# Patient Record
Sex: Female | Born: 1955
Health system: Southern US, Community
[De-identification: ages and names within clinical notes are randomized; demographics above are authoritative.]

## PROBLEM LIST (undated history)

## (undated) DIAGNOSIS — F329 Major depressive disorder, single episode, unspecified: Secondary | ICD-10-CM

## (undated) DIAGNOSIS — N200 Calculus of kidney: Secondary | ICD-10-CM

## (undated) DIAGNOSIS — F32A Depression, unspecified: Secondary | ICD-10-CM

## (undated) DIAGNOSIS — E7800 Pure hypercholesterolemia, unspecified: Secondary | ICD-10-CM

---

## 1999-05-20 ENCOUNTER — Other Ambulatory Visit: Admission: RE | Admit: 1999-05-20 | Discharge: 1999-05-20 | Payer: Self-pay | Admitting: *Deleted

## 2000-06-01 ENCOUNTER — Other Ambulatory Visit: Admission: RE | Admit: 2000-06-01 | Discharge: 2000-06-01 | Payer: Self-pay | Admitting: *Deleted

## 2000-06-13 ENCOUNTER — Encounter: Admission: RE | Admit: 2000-06-13 | Discharge: 2000-06-13 | Payer: Self-pay | Admitting: Family Medicine

## 2000-06-13 ENCOUNTER — Encounter: Payer: Self-pay | Admitting: Family Medicine

## 2001-07-16 ENCOUNTER — Other Ambulatory Visit: Admission: RE | Admit: 2001-07-16 | Discharge: 2001-07-16 | Payer: Self-pay | Admitting: *Deleted

## 2002-07-23 ENCOUNTER — Other Ambulatory Visit: Admission: RE | Admit: 2002-07-23 | Discharge: 2002-07-23 | Payer: Self-pay | Admitting: *Deleted

## 2002-07-30 ENCOUNTER — Encounter: Payer: Self-pay | Admitting: Family Medicine

## 2002-07-30 ENCOUNTER — Encounter: Admission: RE | Admit: 2002-07-30 | Discharge: 2002-07-30 | Payer: Self-pay | Admitting: Family Medicine

## 2003-12-02 ENCOUNTER — Other Ambulatory Visit: Admission: RE | Admit: 2003-12-02 | Discharge: 2003-12-02 | Payer: Self-pay | Admitting: *Deleted

## 2003-12-03 ENCOUNTER — Encounter: Admission: RE | Admit: 2003-12-03 | Discharge: 2003-12-03 | Payer: Self-pay | Admitting: Family Medicine

## 2005-03-18 ENCOUNTER — Other Ambulatory Visit: Admission: RE | Admit: 2005-03-18 | Discharge: 2005-03-18 | Payer: Self-pay | Admitting: *Deleted

## 2005-03-29 ENCOUNTER — Encounter: Admission: RE | Admit: 2005-03-29 | Discharge: 2005-03-29 | Payer: Self-pay | Admitting: Obstetrics and Gynecology

## 2007-05-15 ENCOUNTER — Other Ambulatory Visit: Admission: RE | Admit: 2007-05-15 | Discharge: 2007-05-15 | Payer: Self-pay | Admitting: Family Medicine

## 2008-05-22 ENCOUNTER — Other Ambulatory Visit: Admission: RE | Admit: 2008-05-22 | Discharge: 2008-05-22 | Payer: Self-pay | Admitting: Family Medicine

## 2008-06-19 ENCOUNTER — Encounter: Admission: RE | Admit: 2008-06-19 | Discharge: 2008-06-19 | Payer: Self-pay | Admitting: Family Medicine

## 2009-12-29 ENCOUNTER — Encounter: Admission: RE | Admit: 2009-12-29 | Discharge: 2009-12-29 | Payer: Self-pay | Admitting: Family Medicine

## 2010-01-06 ENCOUNTER — Other Ambulatory Visit: Admission: RE | Admit: 2010-01-06 | Discharge: 2010-01-06 | Payer: Self-pay | Admitting: Family Medicine

## 2010-08-08 ENCOUNTER — Encounter: Payer: Self-pay | Admitting: Family Medicine

## 2011-01-20 ENCOUNTER — Other Ambulatory Visit: Payer: Self-pay | Admitting: Family Medicine

## 2011-01-20 DIAGNOSIS — Z1231 Encounter for screening mammogram for malignant neoplasm of breast: Secondary | ICD-10-CM

## 2011-01-25 ENCOUNTER — Ambulatory Visit
Admission: RE | Admit: 2011-01-25 | Discharge: 2011-01-25 | Disposition: A | Discharge status: Home / Self Care | Payer: BC Managed Care – PPO | Source: Ambulatory Visit | Attending: Family Medicine | Admitting: Family Medicine

## 2011-01-25 DIAGNOSIS — Z1231 Encounter for screening mammogram for malignant neoplasm of breast: Secondary | ICD-10-CM

## 2011-01-28 ENCOUNTER — Other Ambulatory Visit: Payer: Self-pay | Admitting: Family Medicine

## 2011-01-28 DIAGNOSIS — R928 Other abnormal and inconclusive findings on diagnostic imaging of breast: Secondary | ICD-10-CM

## 2011-02-01 ENCOUNTER — Ambulatory Visit
Admission: RE | Admit: 2011-02-01 | Discharge: 2011-02-01 | Disposition: A | Discharge status: Home / Self Care | Payer: BC Managed Care – PPO | Source: Ambulatory Visit | Attending: Family Medicine | Admitting: Family Medicine

## 2011-02-01 ENCOUNTER — Other Ambulatory Visit: Payer: Self-pay | Admitting: Family Medicine

## 2011-02-01 DIAGNOSIS — R928 Other abnormal and inconclusive findings on diagnostic imaging of breast: Secondary | ICD-10-CM

## 2011-02-02 ENCOUNTER — Ambulatory Visit
Admission: RE | Admit: 2011-02-02 | Discharge: 2011-02-02 | Disposition: A | Discharge status: Home / Self Care | Payer: BC Managed Care – PPO | Source: Ambulatory Visit | Attending: Family Medicine | Admitting: Family Medicine

## 2011-02-02 ENCOUNTER — Other Ambulatory Visit: Payer: Self-pay | Admitting: Radiology

## 2011-02-02 ENCOUNTER — Other Ambulatory Visit: Payer: Self-pay | Admitting: Family Medicine

## 2011-02-02 DIAGNOSIS — R928 Other abnormal and inconclusive findings on diagnostic imaging of breast: Secondary | ICD-10-CM

## 2013-03-04 ENCOUNTER — Other Ambulatory Visit (HOSPITAL_COMMUNITY)
Admission: RE | Admit: 2013-03-04 | Discharge: 2013-03-04 | Disposition: A | Discharge status: Home / Self Care | Payer: BC Managed Care – PPO | Source: Ambulatory Visit | Attending: Family Medicine | Admitting: Family Medicine

## 2013-03-04 ENCOUNTER — Other Ambulatory Visit: Payer: Self-pay | Admitting: Family Medicine

## 2013-03-04 DIAGNOSIS — Z124 Encounter for screening for malignant neoplasm of cervix: Secondary | ICD-10-CM | POA: Insufficient documentation

## 2016-03-14 ENCOUNTER — Other Ambulatory Visit (HOSPITAL_COMMUNITY)
Admission: RE | Admit: 2016-03-14 | Discharge: 2016-03-14 | Disposition: A | Discharge status: Home / Self Care | Payer: BLUE CROSS/BLUE SHIELD | Source: Ambulatory Visit | Attending: Family Medicine | Admitting: Family Medicine

## 2016-03-14 ENCOUNTER — Other Ambulatory Visit: Payer: Self-pay | Admitting: Family Medicine

## 2016-03-14 DIAGNOSIS — F419 Anxiety disorder, unspecified: Secondary | ICD-10-CM | POA: Diagnosis not present

## 2016-03-14 DIAGNOSIS — E559 Vitamin D deficiency, unspecified: Secondary | ICD-10-CM | POA: Diagnosis not present

## 2016-03-14 DIAGNOSIS — Z124 Encounter for screening for malignant neoplasm of cervix: Secondary | ICD-10-CM | POA: Insufficient documentation

## 2016-03-14 DIAGNOSIS — E7800 Pure hypercholesterolemia, unspecified: Secondary | ICD-10-CM | POA: Diagnosis not present

## 2016-03-14 DIAGNOSIS — Z0000 Encounter for general adult medical examination without abnormal findings: Secondary | ICD-10-CM | POA: Diagnosis not present

## 2016-03-14 DIAGNOSIS — F339 Major depressive disorder, recurrent, unspecified: Secondary | ICD-10-CM | POA: Diagnosis not present

## 2016-03-14 DIAGNOSIS — Z79899 Other long term (current) drug therapy: Secondary | ICD-10-CM | POA: Diagnosis not present

## 2016-03-15 LAB — CYTOLOGY - PAP

## 2016-04-27 ENCOUNTER — Other Ambulatory Visit: Payer: Self-pay | Admitting: Family Medicine

## 2016-04-27 DIAGNOSIS — N63 Unspecified lump in unspecified breast: Secondary | ICD-10-CM

## 2016-05-04 ENCOUNTER — Ambulatory Visit
Admission: RE | Admit: 2016-05-04 | Discharge: 2016-05-04 | Disposition: A | Discharge status: Home / Self Care | Payer: BLUE CROSS/BLUE SHIELD | Source: Ambulatory Visit | Attending: Family Medicine | Admitting: Family Medicine

## 2016-05-04 DIAGNOSIS — N6012 Diffuse cystic mastopathy of left breast: Secondary | ICD-10-CM | POA: Diagnosis not present

## 2016-05-04 DIAGNOSIS — N6320 Unspecified lump in the left breast, unspecified quadrant: Secondary | ICD-10-CM | POA: Diagnosis not present

## 2016-05-04 DIAGNOSIS — N63 Unspecified lump in unspecified breast: Secondary | ICD-10-CM

## 2016-09-15 DIAGNOSIS — E7800 Pure hypercholesterolemia, unspecified: Secondary | ICD-10-CM | POA: Diagnosis not present

## 2016-09-15 DIAGNOSIS — E559 Vitamin D deficiency, unspecified: Secondary | ICD-10-CM | POA: Diagnosis not present

## 2016-09-15 DIAGNOSIS — F411 Generalized anxiety disorder: Secondary | ICD-10-CM | POA: Diagnosis not present

## 2016-09-15 DIAGNOSIS — F339 Major depressive disorder, recurrent, unspecified: Secondary | ICD-10-CM | POA: Diagnosis not present

## 2017-02-20 DIAGNOSIS — S161XXA Strain of muscle, fascia and tendon at neck level, initial encounter: Secondary | ICD-10-CM | POA: Diagnosis not present

## 2017-02-25 DIAGNOSIS — M542 Cervicalgia: Secondary | ICD-10-CM | POA: Diagnosis not present

## 2017-03-06 DIAGNOSIS — J069 Acute upper respiratory infection, unspecified: Secondary | ICD-10-CM | POA: Diagnosis not present

## 2017-03-06 DIAGNOSIS — M542 Cervicalgia: Secondary | ICD-10-CM | POA: Diagnosis not present

## 2017-03-06 DIAGNOSIS — S46812D Strain of other muscles, fascia and tendons at shoulder and upper arm level, left arm, subsequent encounter: Secondary | ICD-10-CM | POA: Diagnosis not present

## 2017-03-27 DIAGNOSIS — Z0000 Encounter for general adult medical examination without abnormal findings: Secondary | ICD-10-CM | POA: Diagnosis not present

## 2017-03-27 DIAGNOSIS — E7800 Pure hypercholesterolemia, unspecified: Secondary | ICD-10-CM | POA: Diagnosis not present

## 2017-03-27 DIAGNOSIS — Z23 Encounter for immunization: Secondary | ICD-10-CM | POA: Diagnosis not present

## 2017-03-27 DIAGNOSIS — F419 Anxiety disorder, unspecified: Secondary | ICD-10-CM | POA: Diagnosis not present

## 2017-03-27 DIAGNOSIS — E559 Vitamin D deficiency, unspecified: Secondary | ICD-10-CM | POA: Diagnosis not present

## 2017-05-15 ENCOUNTER — Other Ambulatory Visit: Payer: Self-pay | Admitting: Family Medicine

## 2017-05-15 DIAGNOSIS — Z139 Encounter for screening, unspecified: Secondary | ICD-10-CM

## 2017-06-21 ENCOUNTER — Ambulatory Visit: Payer: BLUE CROSS/BLUE SHIELD

## 2017-06-24 DIAGNOSIS — J069 Acute upper respiratory infection, unspecified: Secondary | ICD-10-CM | POA: Diagnosis not present

## 2017-07-21 ENCOUNTER — Ambulatory Visit: Payer: BLUE CROSS/BLUE SHIELD

## 2017-09-25 DIAGNOSIS — F339 Major depressive disorder, recurrent, unspecified: Secondary | ICD-10-CM | POA: Diagnosis not present

## 2017-09-25 DIAGNOSIS — E7800 Pure hypercholesterolemia, unspecified: Secondary | ICD-10-CM | POA: Diagnosis not present

## 2017-09-25 DIAGNOSIS — R7303 Prediabetes: Secondary | ICD-10-CM | POA: Diagnosis not present

## 2017-09-25 DIAGNOSIS — F419 Anxiety disorder, unspecified: Secondary | ICD-10-CM | POA: Diagnosis not present

## 2017-09-25 DIAGNOSIS — E559 Vitamin D deficiency, unspecified: Secondary | ICD-10-CM | POA: Diagnosis not present

## 2017-09-25 DIAGNOSIS — R635 Abnormal weight gain: Secondary | ICD-10-CM | POA: Diagnosis not present

## 2017-11-17 DIAGNOSIS — R1031 Right lower quadrant pain: Secondary | ICD-10-CM | POA: Diagnosis not present

## 2017-11-17 DIAGNOSIS — N201 Calculus of ureter: Secondary | ICD-10-CM | POA: Diagnosis not present

## 2017-11-17 DIAGNOSIS — R112 Nausea with vomiting, unspecified: Secondary | ICD-10-CM | POA: Diagnosis not present

## 2017-11-17 DIAGNOSIS — N1330 Unspecified hydronephrosis: Secondary | ICD-10-CM | POA: Diagnosis not present

## 2017-11-17 DIAGNOSIS — N132 Hydronephrosis with renal and ureteral calculous obstruction: Secondary | ICD-10-CM | POA: Diagnosis not present

## 2017-11-17 DIAGNOSIS — N2889 Other specified disorders of kidney and ureter: Secondary | ICD-10-CM | POA: Diagnosis not present

## 2017-11-17 DIAGNOSIS — K760 Fatty (change of) liver, not elsewhere classified: Secondary | ICD-10-CM | POA: Diagnosis not present

## 2017-11-17 DIAGNOSIS — R1011 Right upper quadrant pain: Secondary | ICD-10-CM | POA: Diagnosis not present

## 2018-01-16 DIAGNOSIS — H524 Presbyopia: Secondary | ICD-10-CM | POA: Diagnosis not present

## 2018-01-20 ENCOUNTER — Emergency Department (HOSPITAL_COMMUNITY)
Admission: EM | Admit: 2018-01-20 | Discharge: 2018-01-20 | Disposition: A | Discharge status: Home / Self Care | Payer: BLUE CROSS/BLUE SHIELD | Attending: Emergency Medicine | Admitting: Emergency Medicine

## 2018-01-20 ENCOUNTER — Encounter (HOSPITAL_COMMUNITY): Payer: Self-pay

## 2018-01-20 ENCOUNTER — Other Ambulatory Visit: Payer: Self-pay

## 2018-01-20 DIAGNOSIS — N200 Calculus of kidney: Secondary | ICD-10-CM

## 2018-01-20 DIAGNOSIS — F329 Major depressive disorder, single episode, unspecified: Secondary | ICD-10-CM | POA: Insufficient documentation

## 2018-01-20 DIAGNOSIS — R1030 Lower abdominal pain, unspecified: Secondary | ICD-10-CM | POA: Diagnosis not present

## 2018-01-20 DIAGNOSIS — E7800 Pure hypercholesterolemia, unspecified: Secondary | ICD-10-CM | POA: Diagnosis not present

## 2018-01-20 HISTORY — DX: Pure hypercholesterolemia, unspecified: E78.00

## 2018-01-20 HISTORY — DX: Depression, unspecified: F32.A

## 2018-01-20 HISTORY — DX: Calculus of kidney: N20.0

## 2018-01-20 HISTORY — DX: Major depressive disorder, single episode, unspecified: F32.9

## 2018-01-20 LAB — CBC WITH DIFFERENTIAL/PLATELET
BASOS ABS: 0 10*3/uL (ref 0.0–0.1)
Basophils Relative: 0 %
Eosinophils Absolute: 0.1 10*3/uL (ref 0.0–0.7)
Eosinophils Relative: 1 %
HCT: 42.8 % (ref 36.0–46.0)
Hemoglobin: 14.6 g/dL (ref 12.0–15.0)
LYMPHS PCT: 23 %
Lymphs Abs: 1.8 10*3/uL (ref 0.7–4.0)
MCH: 30.8 pg (ref 26.0–34.0)
MCHC: 34.1 g/dL (ref 30.0–36.0)
MCV: 90.3 fL (ref 78.0–100.0)
MONO ABS: 0.3 10*3/uL (ref 0.1–1.0)
Monocytes Relative: 4 %
NEUTROS ABS: 5.7 10*3/uL (ref 1.7–7.7)
Neutrophils Relative %: 72 %
Platelets: 288 10*3/uL (ref 150–400)
RBC: 4.74 MIL/uL (ref 3.87–5.11)
RDW: 13.1 % (ref 11.5–15.5)
WBC: 7.9 10*3/uL (ref 4.0–10.5)

## 2018-01-20 LAB — URINALYSIS, ROUTINE W REFLEX MICROSCOPIC
BILIRUBIN URINE: NEGATIVE
GLUCOSE, UA: NEGATIVE mg/dL
KETONES UR: NEGATIVE mg/dL
LEUKOCYTES UA: NEGATIVE
NITRITE: NEGATIVE
Protein, ur: NEGATIVE mg/dL
RBC / HPF: >50 RBC/hpf — ABNORMAL HIGH (ref 0–5)
SPECIFIC GRAVITY, URINE: 1.016 (ref 1.005–1.030)
pH: 7 (ref 5.0–8.0)

## 2018-01-20 LAB — BASIC METABOLIC PANEL
Anion gap: 11 (ref 5–15)
BUN: 14 mg/dL (ref 8–23)
CALCIUM: 9.3 mg/dL (ref 8.9–10.3)
CO2: 24 mmol/L (ref 22–32)
Chloride: 106 mmol/L (ref 98–111)
Creatinine, Ser: 1 mg/dL (ref 0.44–1.00)
GFR calc Af Amer: >60 mL/min (ref >60)
GFR, EST NON AFRICAN AMERICAN: 59 mL/min — AB (ref >60)
GLUCOSE: 168 mg/dL — AB (ref 70–99)
Potassium: 3.5 mmol/L (ref 3.5–5.1)
Sodium: 141 mmol/L (ref 135–145)

## 2018-01-20 MED ORDER — MORPHINE SULFATE (PF) 4 MG/ML IV SOLN
4.0000 mg | Freq: Once | INTRAVENOUS | Status: AC
  Administered 2018-01-20: 4 mg via INTRAVENOUS
  Filled 2018-01-20: qty 1

## 2018-01-20 MED ORDER — ONDANSETRON 8 MG PO TBDP: 8.0000 mg | ORAL_TABLET | Freq: Three times a day (TID) | ORAL | 0 refills | Status: DC | PRN

## 2018-01-20 MED ORDER — TAMSULOSIN HCL 0.4 MG PO CAPS: 0.4000 mg | ORAL_CAPSULE | Freq: Every day | ORAL | 0 refills | Status: AC

## 2018-01-20 MED ORDER — ONDANSETRON HCL 4 MG/2ML IJ SOLN
4.0000 mg | Freq: Once | INTRAMUSCULAR | Status: AC
  Administered 2018-01-20: 4 mg via INTRAVENOUS
  Filled 2018-01-20: qty 2

## 2018-01-20 MED ORDER — OXYCODONE-ACETAMINOPHEN 5-325 MG PO TABS: 1.0000 | ORAL_TABLET | ORAL | 0 refills | Status: AC | PRN

## 2018-01-20 MED ORDER — KETOROLAC TROMETHAMINE 30 MG/ML IJ SOLN
30.0000 mg | Freq: Once | INTRAMUSCULAR | Status: AC
  Administered 2018-01-20: 30 mg via INTRAVENOUS
  Filled 2018-01-20: qty 1

## 2018-01-20 NOTE — ED Notes (Signed)
Pt cannot use restroom at this time, aware urine specimen is needed.  

## 2018-01-20 NOTE — Discharge Instructions (Signed)
Take ibuprofen for mild pain. Take percocet for severe pain as prescribed as needed. Zofran for nausea and vomiting. Flomax to help you pass the stone. Follow up with urology. Return if worsening pain, vomiting, fever, any new concerning symptom.

## 2018-01-20 NOTE — ED Triage Notes (Signed)
She c/o left flank pain which started at about 4am today.

## 2018-01-20 NOTE — ED Provider Notes (Signed)
[facility] DEPT Provider Note   CSN: [id number] Arrival date & time: 01/20/18  4098     History   Chief Complaint Chief Complaint  Patient presents with  . Flank Pain    HPI Jennifer Richardson is a 62 y.o. female.  HPI   Jennifer Richardson is a 61 y.o. female presents to ED with complaint of flank pain. Pt states she was in the hospital about a month ago in [place] with similar pain and was told she had a kidney stone.  She states that she passed a stone at home and pain improved.  She states she was told she had another stone inside her kidney that they saw on the CT scan.  She states that this pain feels exactly the same.  It started at 4 AM this morning.  Associated with nausea and vomiting.  No urinary symptoms.  No changes in bowels.  No fever or chills.  No vaginal complaints.  She has not tried any medications prior to coming in.  Pain is sharp, in the left flank, radiates into the left lower abdomen.  Nothing makes it better or worse.   Past Medical History:  Diagnosis Date  . Depression   . Hypercholesterolemia   . Kidney stone     Patient Active Problem List   Diagnosis Date Noted  . Hypercholesterolemia     The histories are not reviewed yet. Please review them in the "History" navigator section and refresh this [place].   OB History   None      Home Medications    Prior to Admission medications   Not on File    Family History No family history on file.  Social History Social History   Tobacco Use  . Smoking status: Not on file  Substance Use Topics  . Alcohol use: Not on file  . Drug use: Not on file     Allergies   Patient has no known allergies.   Review of Systems Review of Systems  Constitutional: Negative for chills and fever.  Respiratory: Negative for cough, chest tightness and shortness of breath.   Cardiovascular: Negative for chest pain, palpitations and leg swelling.  Gastrointestinal: Positive  for abdominal pain, nausea and vomiting. Negative for diarrhea.  Genitourinary: Positive for flank pain. Negative for dysuria, pelvic pain, vaginal bleeding, vaginal discharge and vaginal pain.  Musculoskeletal: Negative for arthralgias, myalgias, neck pain and neck stiffness.  Skin: Negative for rash.  Neurological: Negative for dizziness, weakness and headaches.  All other systems reviewed and are negative.    Physical Exam Updated Vital Signs BP (!) 164/95   Pulse 64   Temp 98 F (36.7 C) (Oral)   Resp 18   SpO2 97%   Physical Exam  Constitutional: She appears well-developed and well-nourished.  Appears to be uncomfortable, diaphoretic  HENT:  Head: Normocephalic.  Eyes: Conjunctivae are normal.  Neck: Neck supple.  Cardiovascular: Normal rate, regular rhythm and normal heart sounds.  Pulmonary/Chest: Effort normal and breath sounds normal. No respiratory distress. She has no wheezes. She has no rales.  Abdominal: Soft. Bowel sounds are normal. She exhibits no distension. There is tenderness. There is no rebound.  Left lower quadrant and left upper quadrant tenderness, left CVA tenderness  Musculoskeletal: She exhibits no edema.  Neurological: She is alert.  Skin: Skin is warm and dry.  Psychiatric: She has a normal mood and affect. Her behavior is normal.  Nursing note and vitals reviewed.    ED  Treatments / Results  Labs (all labs ordered are listed, but only abnormal results are displayed) Labs Reviewed  CBC WITH DIFFERENTIAL/PLATELET  BASIC METABOLIC PANEL  URINALYSIS, ROUTINE W REFLEX MICROSCOPIC    EKG None  Radiology No results found.  Procedures Procedures (including critical care time)  Medications Ordered in ED Medications  ketorolac (TORADOL) 30 MG/ML injection 30 mg (has no administration in time range)  morphine 4 MG/ML injection 4 mg (has no administration in time range)  ondansetron (ZOFRAN) injection 4 mg (has no administration in time  range)     Initial Impression / Assessment and Plan / ED Course  I have reviewed the triage vital signs and the nursing notes.  Pertinent labs & imaging results that were available during my care of the patient were reviewed by me and considered in my medical decision making (see chart for details).     Patient with recurrent flank pain, was recently diagnosed with a kidney stone, that she states she passed.  She was told she had another stone in her kidney and she feels like she might be passing another kidney stone.  Patient asked not to have any advanced test performed unless absolutely necessary due to financial reasons.  I think is reasonable to check her urine to make sure she is not having an infection, get CBC and be met, and control her pain.  9:21 AM Urine analysis showing hematuria, no signs of infection.  Normal white blood cell count.  Normal renal function.  Patient feels much better after receiving 4 mg of morphine, 30 mg of Toradol IV.  She states pain practically resolved.  I do not think that patient needs imaging on emergent basis at this time given that she has a known kidney stone.  We will have her follow-up with urology.  Will give prescription for pain medications and Flomax.  Vitals:   01/20/18 0723  BP: (!) 164/95  Pulse: 64  Resp: 18  Temp: 98 F (36.7 C)  TempSrc: Oral  SpO2: 97%     Final Clinical Impressions(s) / ED Diagnoses   Final diagnoses:  Hypercholesterolemia    ED Discharge Orders    None       Jeannett Senior, PA-C 01/20/18 [id number]    Isla Pence, MD 01/20/18 [phone]

## 2018-01-22 DIAGNOSIS — N200 Calculus of kidney: Secondary | ICD-10-CM | POA: Diagnosis not present

## 2018-01-22 DIAGNOSIS — T7840XA Allergy, unspecified, initial encounter: Secondary | ICD-10-CM | POA: Diagnosis not present

## 2018-01-22 DIAGNOSIS — L989 Disorder of the skin and subcutaneous tissue, unspecified: Secondary | ICD-10-CM | POA: Diagnosis not present

## 2018-04-06 DIAGNOSIS — E7800 Pure hypercholesterolemia, unspecified: Secondary | ICD-10-CM | POA: Diagnosis not present

## 2018-04-06 DIAGNOSIS — F339 Major depressive disorder, recurrent, unspecified: Secondary | ICD-10-CM | POA: Diagnosis not present

## 2018-04-06 DIAGNOSIS — E559 Vitamin D deficiency, unspecified: Secondary | ICD-10-CM | POA: Diagnosis not present

## 2018-04-06 DIAGNOSIS — F419 Anxiety disorder, unspecified: Secondary | ICD-10-CM | POA: Diagnosis not present

## 2018-04-06 DIAGNOSIS — Z0000 Encounter for general adult medical examination without abnormal findings: Secondary | ICD-10-CM | POA: Diagnosis not present

## 2018-04-06 DIAGNOSIS — R7303 Prediabetes: Secondary | ICD-10-CM | POA: Diagnosis not present

## 2018-10-08 DIAGNOSIS — E7800 Pure hypercholesterolemia, unspecified: Secondary | ICD-10-CM | POA: Diagnosis not present

## 2018-10-08 DIAGNOSIS — R7303 Prediabetes: Secondary | ICD-10-CM | POA: Diagnosis not present

## 2018-10-08 DIAGNOSIS — F419 Anxiety disorder, unspecified: Secondary | ICD-10-CM | POA: Diagnosis not present

## 2018-10-08 DIAGNOSIS — F339 Major depressive disorder, recurrent, unspecified: Secondary | ICD-10-CM | POA: Diagnosis not present

## 2019-04-18 DIAGNOSIS — E7800 Pure hypercholesterolemia, unspecified: Secondary | ICD-10-CM | POA: Diagnosis not present

## 2019-04-18 DIAGNOSIS — R7303 Prediabetes: Secondary | ICD-10-CM | POA: Diagnosis not present

## 2019-04-18 DIAGNOSIS — E559 Vitamin D deficiency, unspecified: Secondary | ICD-10-CM | POA: Diagnosis not present

## 2019-04-18 DIAGNOSIS — F419 Anxiety disorder, unspecified: Secondary | ICD-10-CM | POA: Diagnosis not present

## 2019-07-13 DIAGNOSIS — R110 Nausea: Secondary | ICD-10-CM | POA: Diagnosis not present

## 2019-07-13 DIAGNOSIS — R42 Dizziness and giddiness: Secondary | ICD-10-CM | POA: Diagnosis not present

## 2019-07-13 DIAGNOSIS — Z03818 Encounter for observation for suspected exposure to other biological agents ruled out: Secondary | ICD-10-CM | POA: Diagnosis not present

## 2019-07-13 DIAGNOSIS — R509 Fever, unspecified: Secondary | ICD-10-CM | POA: Diagnosis not present

## 2020-02-21 DIAGNOSIS — Z20822 Contact with and (suspected) exposure to covid-19: Secondary | ICD-10-CM | POA: Diagnosis not present

## 2020-03-23 DIAGNOSIS — R69 Illness, unspecified: Secondary | ICD-10-CM | POA: Diagnosis not present

## 2020-04-02 DIAGNOSIS — R7303 Prediabetes: Secondary | ICD-10-CM | POA: Diagnosis not present

## 2020-04-02 DIAGNOSIS — E559 Vitamin D deficiency, unspecified: Secondary | ICD-10-CM | POA: Diagnosis not present

## 2020-04-02 DIAGNOSIS — E7800 Pure hypercholesterolemia, unspecified: Secondary | ICD-10-CM | POA: Diagnosis not present

## 2020-04-02 DIAGNOSIS — R69 Illness, unspecified: Secondary | ICD-10-CM | POA: Diagnosis not present

## 2020-04-09 DIAGNOSIS — R69 Illness, unspecified: Secondary | ICD-10-CM | POA: Diagnosis not present

## 2020-10-01 DIAGNOSIS — E7800 Pure hypercholesterolemia, unspecified: Secondary | ICD-10-CM | POA: Diagnosis not present

## 2020-10-01 DIAGNOSIS — R7303 Prediabetes: Secondary | ICD-10-CM | POA: Diagnosis not present

## 2020-10-01 DIAGNOSIS — E559 Vitamin D deficiency, unspecified: Secondary | ICD-10-CM | POA: Diagnosis not present

## 2020-10-01 DIAGNOSIS — R69 Illness, unspecified: Secondary | ICD-10-CM | POA: Diagnosis not present

## 2021-02-05 DIAGNOSIS — M26602 Left temporomandibular joint disorder, unspecified: Secondary | ICD-10-CM | POA: Diagnosis not present

## 2021-02-18 DIAGNOSIS — K148 Other diseases of tongue: Secondary | ICD-10-CM | POA: Diagnosis not present

## 2021-03-03 ENCOUNTER — Other Ambulatory Visit: Payer: Self-pay | Admitting: Oral Surgery

## 2021-03-03 DIAGNOSIS — R448 Other symptoms and signs involving general sensations and perceptions: Secondary | ICD-10-CM

## 2021-03-26 ENCOUNTER — Ambulatory Visit
Admission: RE | Admit: 2021-03-26 | Discharge: 2021-03-26 | Disposition: A | Discharge status: Home / Self Care | Payer: BLUE CROSS/BLUE SHIELD | Source: Ambulatory Visit | Attending: Oral Surgery | Admitting: Oral Surgery

## 2021-03-26 DIAGNOSIS — I771 Stricture of artery: Secondary | ICD-10-CM | POA: Diagnosis not present

## 2021-03-26 DIAGNOSIS — R202 Paresthesia of skin: Secondary | ICD-10-CM | POA: Diagnosis not present

## 2021-03-26 DIAGNOSIS — I517 Cardiomegaly: Secondary | ICD-10-CM | POA: Diagnosis not present

## 2021-03-26 DIAGNOSIS — R448 Other symptoms and signs involving general sensations and perceptions: Secondary | ICD-10-CM

## 2021-03-26 DIAGNOSIS — M47812 Spondylosis without myelopathy or radiculopathy, cervical region: Secondary | ICD-10-CM | POA: Diagnosis not present

## 2021-03-26 IMAGING — CT CT NECK W/ CM
5 of 6 series · 14 of 33 positions shown, 16 images · IV contrast (iopamidol)
Comparison: None.

CLINICAL DATA: Paresthesias of the tongue lasting 8-10 weeks.

EXAM:
CT NECK WITH CONTRAST
TECHNIQUE: Multidetector CT imaging of the neck was performed using the
standard protocol following the bolus administration of intravenous
contrast.
CONTRAST:  75mL [4X] IOPAMIDOL ([4X]) INJECTION 76%

[Series 3: neck 2.00 br40 s3 st/ no angle · axial · 0.48mm/px · z∈[-747,-671]mm · 2 of 116 slices shown, 3 images]
[im 39/116  soft-tissue]
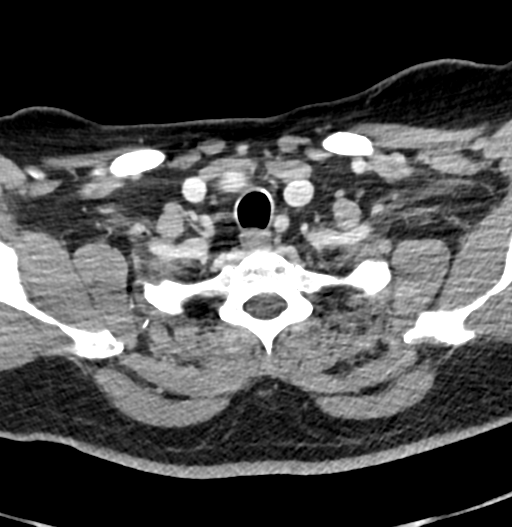
[im 39/116  bone]
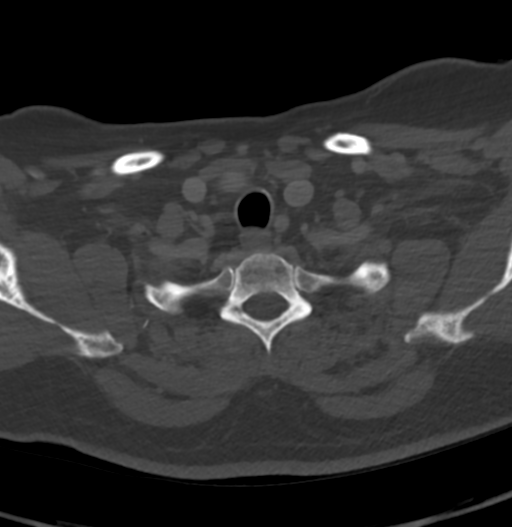
[im 77/116  bone]
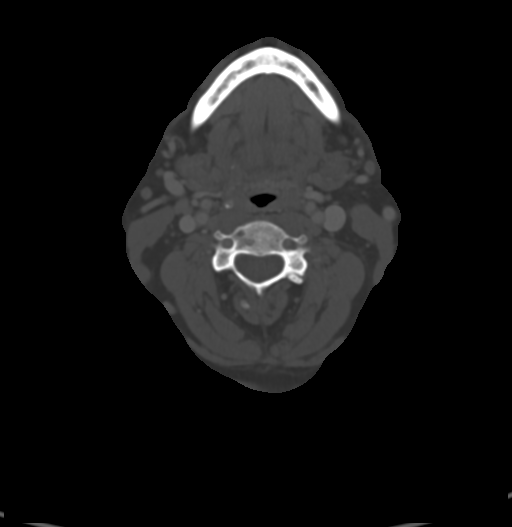

[Series 5: neck 2.00 br60 s3 bone/ no angle · axial · 0.48mm/px · z∈[-747,-671]mm · 2 of 116 slices shown]
[im 39/116  bone]
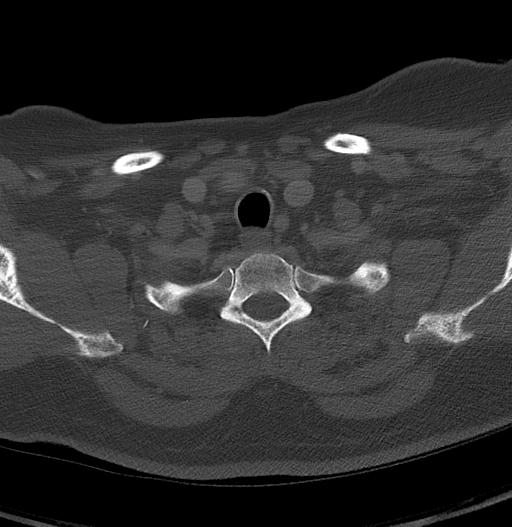
[im 77/116  bone]
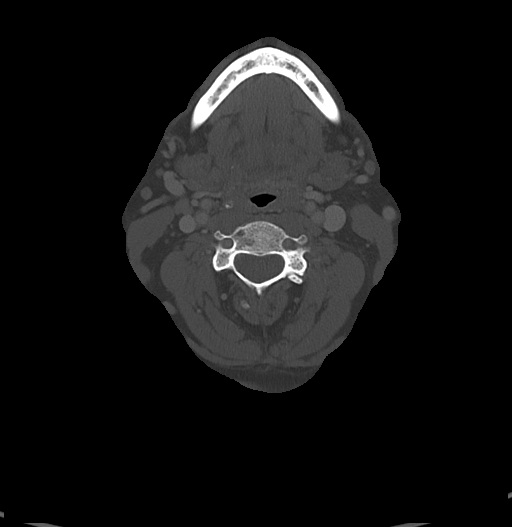

[Series 7: neck 2.00 br36 s3 angled axial (person_name) · axial · 0.48mm/px · z∈[-747,-671]mm · 2 of 116 slices shown]
[im 39/116  bone]
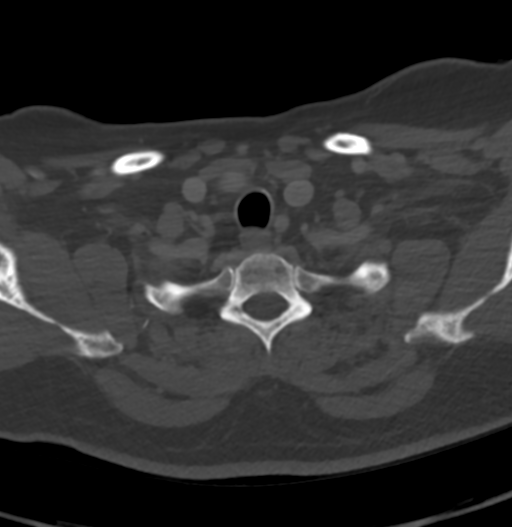
[im 77/116  bone]
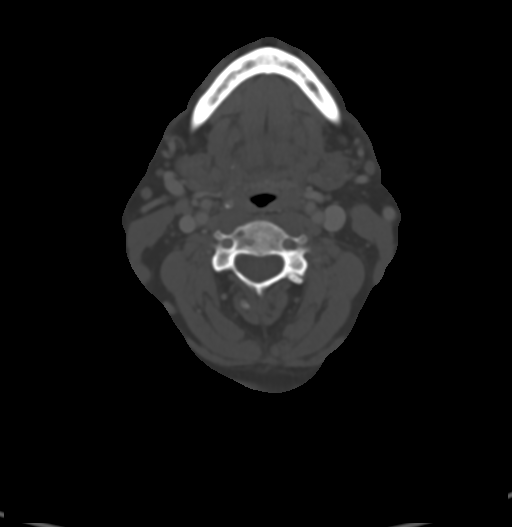

[Series 11: neck 2.00 br40 s3 (person_name) · coronal · 0.46mm/px · 3 of 126 slices shown (1 of 2)]
[im 26/126  bone]
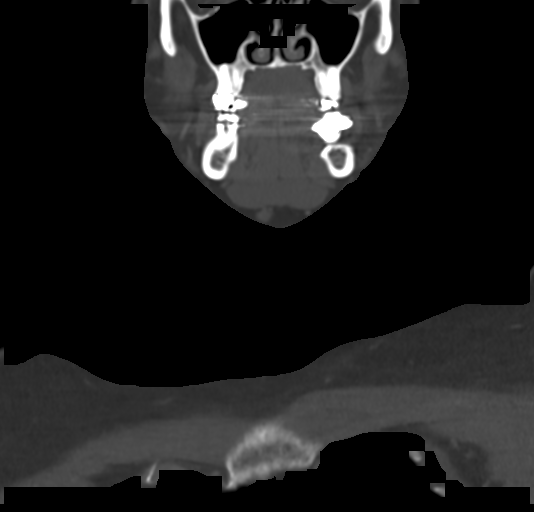
[im 51/126  bone]
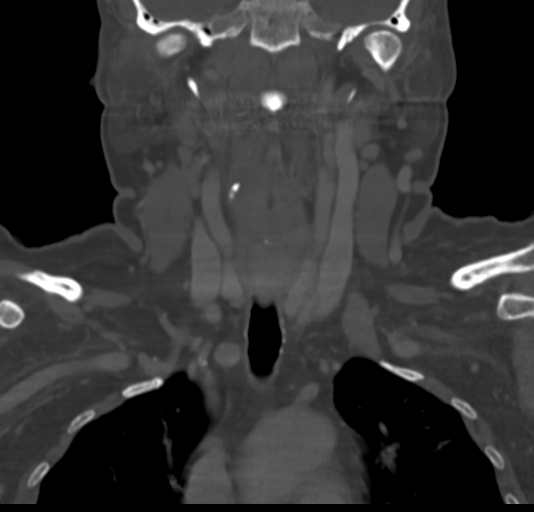
[im 76/126  bone]
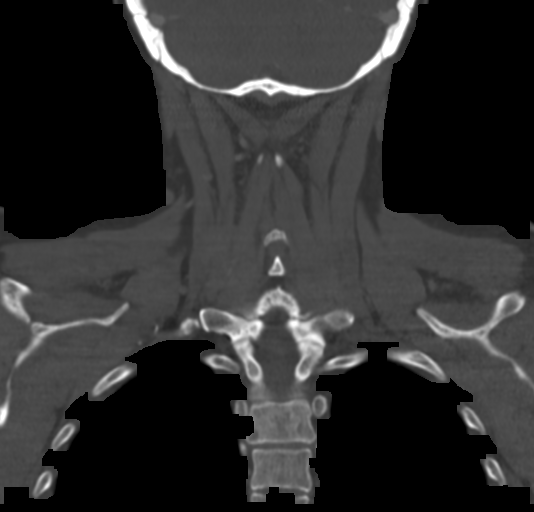

[Series 13: neck 2.00 br40 s3 (person_name) · sagittal · 0.46mm/px · 5 of 122 slices shown, 6 images (2 of 2)]
[im 41/122  bone]
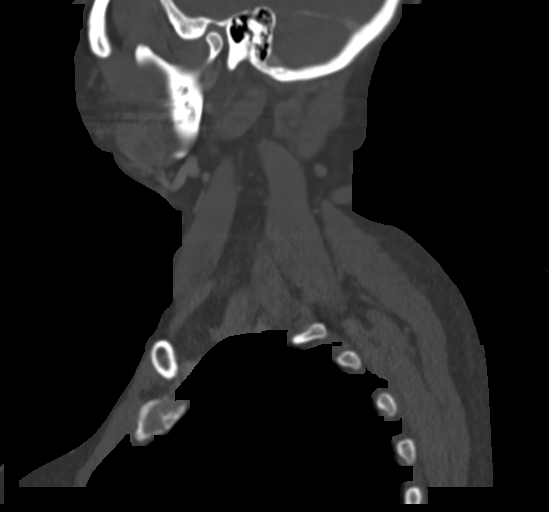
[im 51/122  bone]
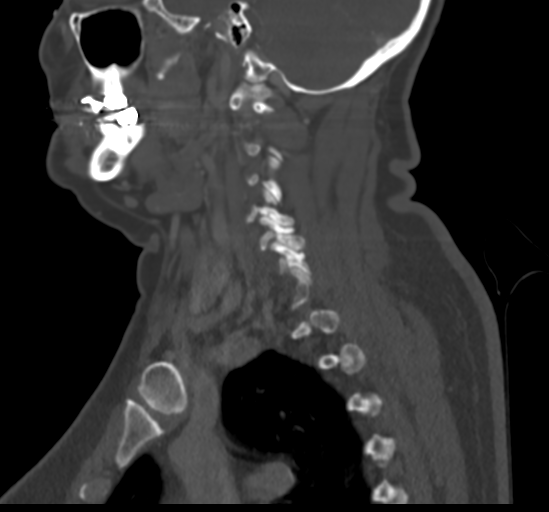
[im 61/122  soft-tissue]
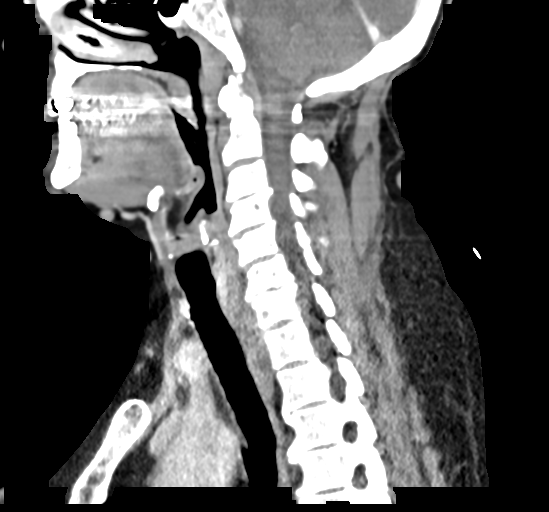
[im 61/122  bone]
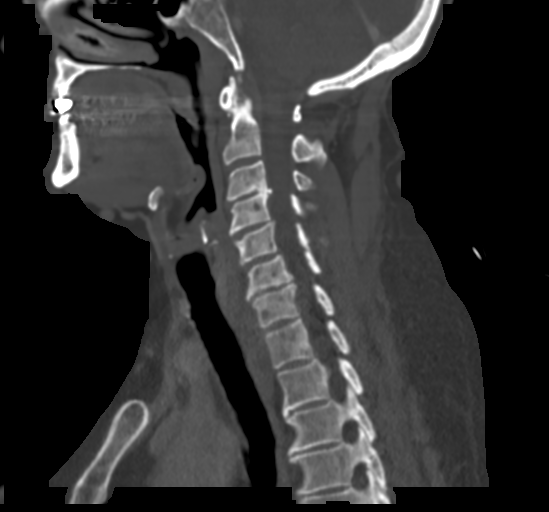
[im 71/122  bone]
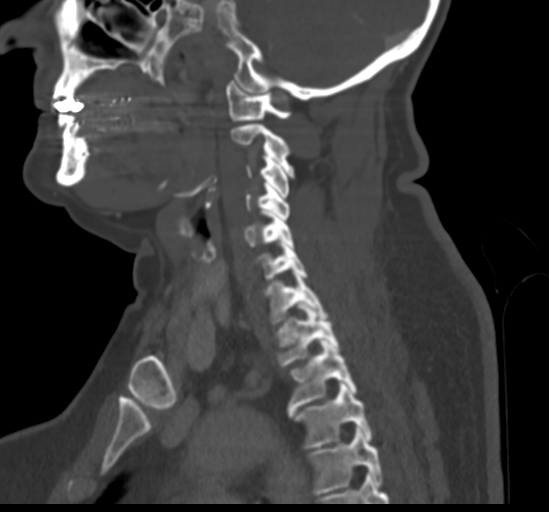
[im 81/122  bone]
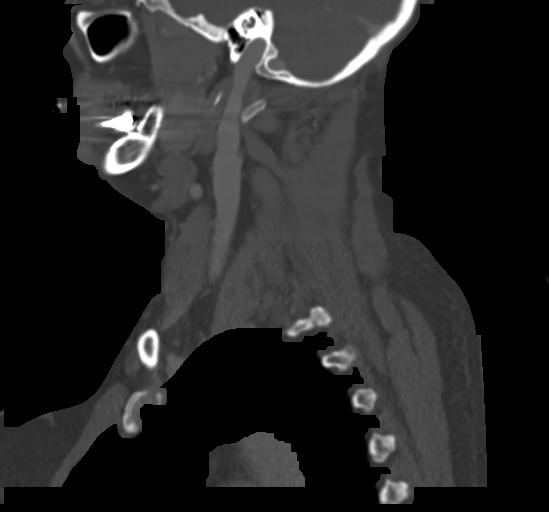

[14 of 33 positions shown; findings below may reference images not displayed]

FINDINGS: Pharynx and larynx: Asymmetric glottis with enlargement of the left
laryngeal ventricle and piriform sinus. No evidence of mass or
inflammation at the pharynx and larynx. Much of the oral tongue is
obscured by streak artifact from dental amalgam. No visible mass
affecting the oral cavity where seen along the lingual neurovascular
bundles.

Salivary glands: No inflammation, mass, or stone.

Thyroid: Normal.

Lymph nodes: None enlarged or abnormal density.

Vascular: ICA tortuosity with looping.

Limited intracranial: Negative

Visualized orbits: Limited coverage is negative

Mastoids and visualized paranasal sinuses: Clear

Skeleton: Ordinary cervical spine degeneration with endplate and
facet spurring.

Upper chest: Negative
IMPRESSION: 1. No visible cause of symptoms. Much of the oral tongue is obscured
by streak artifact from dental hardware.
2. Asymmetric glottis suggesting paresis on the left, please
correlate with visualization.

## 2021-03-26 MED ORDER — IOPAMIDOL (ISOVUE-370) INJECTION 76%
75.0000 mL | Freq: Once | INTRAVENOUS | Status: AC | PRN
  Administered 2021-03-26: 75 mL via INTRAVENOUS

## 2021-04-05 DIAGNOSIS — Z23 Encounter for immunization: Secondary | ICD-10-CM | POA: Diagnosis not present

## 2021-04-05 DIAGNOSIS — R7303 Prediabetes: Secondary | ICD-10-CM | POA: Diagnosis not present

## 2021-04-05 DIAGNOSIS — Z0000 Encounter for general adult medical examination without abnormal findings: Secondary | ICD-10-CM | POA: Diagnosis not present

## 2021-04-05 DIAGNOSIS — E7800 Pure hypercholesterolemia, unspecified: Secondary | ICD-10-CM | POA: Diagnosis not present

## 2021-04-06 DIAGNOSIS — R7303 Prediabetes: Secondary | ICD-10-CM | POA: Diagnosis not present

## 2021-04-06 DIAGNOSIS — E7800 Pure hypercholesterolemia, unspecified: Secondary | ICD-10-CM | POA: Diagnosis not present

## 2021-04-06 DIAGNOSIS — R69 Illness, unspecified: Secondary | ICD-10-CM | POA: Diagnosis not present

## 2021-04-06 DIAGNOSIS — E559 Vitamin D deficiency, unspecified: Secondary | ICD-10-CM | POA: Diagnosis not present

## 2021-04-07 ENCOUNTER — Other Ambulatory Visit: Payer: Self-pay | Admitting: Family Medicine

## 2021-04-07 DIAGNOSIS — Z1231 Encounter for screening mammogram for malignant neoplasm of breast: Secondary | ICD-10-CM

## 2021-04-07 DIAGNOSIS — E2839 Other primary ovarian failure: Secondary | ICD-10-CM

## 2021-10-20 DIAGNOSIS — R69 Illness, unspecified: Secondary | ICD-10-CM | POA: Diagnosis not present

## 2021-10-20 DIAGNOSIS — Z6838 Body mass index (BMI) 38.0-38.9, adult: Secondary | ICD-10-CM | POA: Diagnosis not present

## 2021-10-20 DIAGNOSIS — E7800 Pure hypercholesterolemia, unspecified: Secondary | ICD-10-CM | POA: Diagnosis not present

## 2021-10-20 DIAGNOSIS — E559 Vitamin D deficiency, unspecified: Secondary | ICD-10-CM | POA: Diagnosis not present

## 2021-10-20 DIAGNOSIS — R7303 Prediabetes: Secondary | ICD-10-CM | POA: Diagnosis not present

## 2022-04-06 DIAGNOSIS — Z23 Encounter for immunization: Secondary | ICD-10-CM | POA: Diagnosis not present

## 2022-04-06 DIAGNOSIS — E7800 Pure hypercholesterolemia, unspecified: Secondary | ICD-10-CM | POA: Diagnosis not present

## 2022-04-06 DIAGNOSIS — Z6834 Body mass index (BMI) 34.0-34.9, adult: Secondary | ICD-10-CM | POA: Diagnosis not present

## 2022-04-06 DIAGNOSIS — Z0000 Encounter for general adult medical examination without abnormal findings: Secondary | ICD-10-CM | POA: Diagnosis not present

## 2022-04-06 DIAGNOSIS — R69 Illness, unspecified: Secondary | ICD-10-CM | POA: Diagnosis not present

## 2022-04-06 DIAGNOSIS — E559 Vitamin D deficiency, unspecified: Secondary | ICD-10-CM | POA: Diagnosis not present

## 2022-04-06 DIAGNOSIS — R7303 Prediabetes: Secondary | ICD-10-CM | POA: Diagnosis not present

## 2022-04-06 DIAGNOSIS — R5383 Other fatigue: Secondary | ICD-10-CM | POA: Diagnosis not present

## 2022-04-06 DIAGNOSIS — Z1389 Encounter for screening for other disorder: Secondary | ICD-10-CM | POA: Diagnosis not present

## 2022-04-08 ENCOUNTER — Other Ambulatory Visit: Payer: Self-pay | Admitting: Family Medicine

## 2022-04-08 DIAGNOSIS — E2839 Other primary ovarian failure: Secondary | ICD-10-CM

## 2022-04-08 DIAGNOSIS — Z1231 Encounter for screening mammogram for malignant neoplasm of breast: Secondary | ICD-10-CM

## 2022-06-14 ENCOUNTER — Ambulatory Visit
Admission: RE | Admit: 2022-06-14 | Discharge: 2022-06-14 | Disposition: A | Discharge status: Home / Self Care | Payer: Medicare HMO | Source: Ambulatory Visit | Attending: Family Medicine | Admitting: Family Medicine

## 2022-06-14 DIAGNOSIS — Z1231 Encounter for screening mammogram for malignant neoplasm of breast: Secondary | ICD-10-CM | POA: Diagnosis not present

## 2022-08-23 DIAGNOSIS — K648 Other hemorrhoids: Secondary | ICD-10-CM | POA: Diagnosis not present

## 2022-08-23 DIAGNOSIS — K644 Residual hemorrhoidal skin tags: Secondary | ICD-10-CM | POA: Diagnosis not present

## 2022-08-23 DIAGNOSIS — Z1211 Encounter for screening for malignant neoplasm of colon: Secondary | ICD-10-CM | POA: Diagnosis not present

## 2022-08-23 DIAGNOSIS — D122 Benign neoplasm of ascending colon: Secondary | ICD-10-CM | POA: Diagnosis not present

## 2022-08-25 DIAGNOSIS — D122 Benign neoplasm of ascending colon: Secondary | ICD-10-CM | POA: Diagnosis not present

## 2022-12-24 ENCOUNTER — Emergency Department (HOSPITAL_BASED_OUTPATIENT_CLINIC_OR_DEPARTMENT_OTHER)
Admission: EM | Admit: 2022-12-24 | Discharge: 2022-12-24 | Disposition: A | Discharge status: Home / Self Care | Payer: Medicare HMO | Attending: Emergency Medicine | Admitting: Emergency Medicine

## 2022-12-24 ENCOUNTER — Encounter (HOSPITAL_BASED_OUTPATIENT_CLINIC_OR_DEPARTMENT_OTHER): Payer: Self-pay | Admitting: Emergency Medicine

## 2022-12-24 ENCOUNTER — Other Ambulatory Visit: Payer: Self-pay

## 2022-12-24 ENCOUNTER — Emergency Department (HOSPITAL_BASED_OUTPATIENT_CLINIC_OR_DEPARTMENT_OTHER): Payer: Medicare HMO

## 2022-12-24 DIAGNOSIS — S90112A Contusion of left great toe without damage to nail, initial encounter: Secondary | ICD-10-CM | POA: Diagnosis not present

## 2022-12-24 DIAGNOSIS — S99922A Unspecified injury of left foot, initial encounter: Secondary | ICD-10-CM | POA: Diagnosis not present

## 2022-12-24 DIAGNOSIS — W208XXA Other cause of strike by thrown, projected or falling object, initial encounter: Secondary | ICD-10-CM | POA: Diagnosis not present

## 2022-12-24 DIAGNOSIS — S90932A Unspecified superficial injury of left great toe, initial encounter: Secondary | ICD-10-CM | POA: Diagnosis not present

## 2022-12-24 DIAGNOSIS — S90219A Contusion of unspecified great toe with damage to nail, initial encounter: Secondary | ICD-10-CM

## 2022-12-24 DIAGNOSIS — S90212A Contusion of left great toe with damage to nail, initial encounter: Secondary | ICD-10-CM | POA: Diagnosis not present

## 2022-12-24 DIAGNOSIS — M7989 Other specified soft tissue disorders: Secondary | ICD-10-CM | POA: Diagnosis not present

## 2022-12-24 NOTE — ED Triage Notes (Signed)
Dropped a can of fruit on L great toe x 2 days ago. Bruising noted.

## 2022-12-24 NOTE — ED Notes (Signed)
Came to place dressing on Pts toe, unable to locate Pt at this time.

## 2022-12-24 NOTE — ED Notes (Signed)
Pt left before receiving paperwork

## 2022-12-25 NOTE — ED Provider Notes (Signed)
[place] EMERGENCY DEPARTMENT AT [facility] Provider Note   CSN: [id number] Arrival date & time: 12/24/22  1844     History  Chief Complaint  Patient presents with   Toe Injury    Jennifer Richardson is a 67 y.o. female.  HPI Patient presents with toe injury.  Dropped a can of fruit on it 2 days ago.  Now more pain and swelling.  Not on blood thinners.  No other injury.    Home Medications Prior to Admission medications   Medication Sig Start Date End Date Taking? Authorizing Provider  ondansetron (ZOFRAN ODT) 8 MG disintegrating tablet Take 1 tablet (8 mg total) by mouth every 8 (eight) hours as needed for nausea or vomiting. 01/20/18   Kirichenko, Lemont Fillers, PA-C  oxyCODONE-acetaminophen (PERCOCET) 5-325 MG tablet Take 1 tablet by mouth every 4 (four) hours as needed for severe pain. 01/20/18   Kirichenko, Lemont Fillers, PA-C  tamsulosin (FLOMAX) 0.4 MG CAPS capsule Take 1 capsule (0.4 mg total) by mouth daily. 01/20/18   Kirichenko, Lemont Fillers, PA-C      Allergies    Percocet [oxycodone-acetaminophen]    Review of Systems   Review of Systems  Physical Exam Updated Vital Signs BP (!) 148/66 (BP Location: Right Wrist)   Pulse 72   Temp 98.8 F (37.1 C) (Oral)   Resp 18   Ht 5\' 2"  (1.575 m)   Wt 88.5 kg   SpO2 98%   BMI 35.67 kg/m  Physical Exam Vitals reviewed.  Musculoskeletal:     Comments: Tenderness to great toe on right foot.  Large subungual hematoma.  Does have protruding area from the anterior aspect right below the nail of blood behind it apparent membrane also.  Neurological:     Mental Status: She is alert and oriented to person, place, and time.     ED Results / Procedures / Treatments   Labs (all labs ordered are listed, but only abnormal results are displayed) Labs Reviewed - No data to display  EKG None  Radiology DG Toe Great Left  Result Date: 12/24/2022 CLINICAL DATA:  Left great toe crush injury EXAM: LEFT GREAT TOE COMPARISON:  None  Available. FINDINGS: Normal alignment. No acute fracture or dislocation. Minimal degenerative change involving the interphalangeal joint and metacarpophalangeal joint of the great toe. Mild soft tissue swelling of the great toe surrounding the distal phalanx. IMPRESSION: 1. No acute fracture or dislocation. Electronically Signed   By: Helyn Numbers M.D.   On: 12/24/2022 20:29    Procedures Procedures    Medications Ordered in ED Medications - No data to display  ED Course/ Medical Decision Making/ A&P                             Medical Decision Making Amount and/or Complexity of Data Reviewed Radiology: ordered.   Patient with subungual hematoma.  X-ray done and showed no fracture.  Differential diagnosis includes fracture and laceration.  No other visible laceration on the skin.  11 blade scalpel was used anteriorly below the nail on the area of potential membrane with blood return.  Had been cleaned prior.  Patient felt much better.  Toe wrapped up and given dressing supplies.  Outpatient follow-up as needed.  Patient instructed that she may end up losing the nail.        Final Clinical Impression(s) / ED Diagnoses Final diagnoses:  Subungual hematoma of great toe    Rx / DC  Orders ED Discharge Orders     None         Benjiman Core, MD 12/25/22 [phone]

## 2023-04-26 DIAGNOSIS — E7800 Pure hypercholesterolemia, unspecified: Secondary | ICD-10-CM | POA: Diagnosis not present

## 2023-04-26 DIAGNOSIS — E559 Vitamin D deficiency, unspecified: Secondary | ICD-10-CM | POA: Diagnosis not present

## 2023-04-27 ENCOUNTER — Other Ambulatory Visit: Payer: Self-pay | Admitting: Family Medicine

## 2023-04-27 DIAGNOSIS — E2839 Other primary ovarian failure: Secondary | ICD-10-CM

## 2023-05-10 ENCOUNTER — Other Ambulatory Visit: Payer: Self-pay | Admitting: Family Medicine

## 2023-05-10 DIAGNOSIS — Z1231 Encounter for screening mammogram for malignant neoplasm of breast: Secondary | ICD-10-CM

## 2023-06-20 ENCOUNTER — Ambulatory Visit
Admission: RE | Admit: 2023-06-20 | Discharge: 2023-06-20 | Disposition: A | Discharge status: Home / Self Care | Payer: Medicare HMO | Source: Ambulatory Visit | Attending: Family Medicine | Admitting: Family Medicine

## 2023-06-20 DIAGNOSIS — Z1231 Encounter for screening mammogram for malignant neoplasm of breast: Secondary | ICD-10-CM

## 2023-08-04 ENCOUNTER — Emergency Department (HOSPITAL_BASED_OUTPATIENT_CLINIC_OR_DEPARTMENT_OTHER): Payer: Medicare HMO

## 2023-08-04 ENCOUNTER — Emergency Department (HOSPITAL_BASED_OUTPATIENT_CLINIC_OR_DEPARTMENT_OTHER)
Admission: EM | Admit: 2023-08-04 | Discharge: 2023-08-04 | Disposition: A | Discharge status: Home / Self Care | Payer: Medicare HMO | Attending: Emergency Medicine | Admitting: Emergency Medicine

## 2023-08-04 ENCOUNTER — Encounter (HOSPITAL_BASED_OUTPATIENT_CLINIC_OR_DEPARTMENT_OTHER): Payer: Self-pay

## 2023-08-04 ENCOUNTER — Other Ambulatory Visit: Payer: Self-pay

## 2023-08-04 DIAGNOSIS — I609 Nontraumatic subarachnoid hemorrhage, unspecified: Secondary | ICD-10-CM

## 2023-08-04 DIAGNOSIS — S0990XA Unspecified injury of head, initial encounter: Secondary | ICD-10-CM | POA: Diagnosis not present

## 2023-08-04 DIAGNOSIS — Y92512 Supermarket, store or market as the place of occurrence of the external cause: Secondary | ICD-10-CM | POA: Diagnosis not present

## 2023-08-04 DIAGNOSIS — Z23 Encounter for immunization: Secondary | ICD-10-CM | POA: Diagnosis not present

## 2023-08-04 DIAGNOSIS — S0191XA Laceration without foreign body of unspecified part of head, initial encounter: Secondary | ICD-10-CM | POA: Diagnosis not present

## 2023-08-04 DIAGNOSIS — S066X0A Traumatic subarachnoid hemorrhage without loss of consciousness, initial encounter: Secondary | ICD-10-CM | POA: Diagnosis not present

## 2023-08-04 DIAGNOSIS — S0101XA Laceration without foreign body of scalp, initial encounter: Secondary | ICD-10-CM | POA: Insufficient documentation

## 2023-08-04 DIAGNOSIS — M4802 Spinal stenosis, cervical region: Secondary | ICD-10-CM | POA: Diagnosis not present

## 2023-08-04 DIAGNOSIS — W000XXA Fall on same level due to ice and snow, initial encounter: Secondary | ICD-10-CM | POA: Insufficient documentation

## 2023-08-04 DIAGNOSIS — S199XXA Unspecified injury of neck, initial encounter: Secondary | ICD-10-CM | POA: Diagnosis not present

## 2023-08-04 DIAGNOSIS — M47812 Spondylosis without myelopathy or radiculopathy, cervical region: Secondary | ICD-10-CM | POA: Diagnosis not present

## 2023-08-04 MED ORDER — LIDOCAINE-EPINEPHRINE-TETRACAINE (LET) TOPICAL GEL
3.0000 mL | Freq: Once | TOPICAL | Status: AC
  Administered 2023-08-04: 3 mL via TOPICAL
  Filled 2023-08-04: qty 3

## 2023-08-04 MED ORDER — TETANUS-DIPHTH-ACELL PERTUSSIS 5-2.5-18.5 LF-MCG/0.5 IM SUSY
0.5000 mL | PREFILLED_SYRINGE | Freq: Once | INTRAMUSCULAR | Status: AC
  Administered 2023-08-04: 0.5 mL via INTRAMUSCULAR
  Filled 2023-08-04: qty 0.5

## 2023-08-04 NOTE — ED Provider Notes (Signed)
[place] EMERGENCY DEPARTMENT AT [facility] Provider Note  CSN: [id number] Arrival date & time: 08/04/23 1539  Chief Complaint(s) Fall and Head Injury  HPI Jennifer Richardson is a 68 y.o. female with past medical history as below, significant for depression, HLD, nephrolithiasis, C-section who presents to the ED with complaint of fall, head injury.   She is here with her spouse.  Patient reports that she was leaving the grocery store when she slipped on some ice, fell backwards and hit her head.  She is somewhat amnesic to the fall.  She has some broken memory surrounding the fall, does not believe she had LOC.  She does not recall driving home.  She does not take any blood thinners.  She initially had a headache and some dizziness but this has since resolved.  She has no ongoing neck pain.  She has had some ongoing bleeding from the back of her head from the fall otherwise she feels though she is back to normal.  No numbness or weakness to extremities, no further injuries to the head, no gait disturbance.  No vision changes.  Past Medical History Past Medical History:  Diagnosis Date   Depression    Hypercholesterolemia    Kidney stone    Patient Active Problem List   Diagnosis Date Noted   Hypercholesterolemia    Home Medication(s) Prior to Admission medications   Medication Sig Start Date End Date Taking? Authorizing Provider  ondansetron (ZOFRAN ODT) 8 MG disintegrating tablet Take 1 tablet (8 mg total) by mouth every 8 (eight) hours as needed for nausea or vomiting. 01/20/18   Kirichenko, Lemont Fillers, PA-C  oxyCODONE-acetaminophen (PERCOCET) 5-325 MG tablet Take 1 tablet by mouth every 4 (four) hours as needed for severe pain. 01/20/18   Kirichenko, Lemont Fillers, PA-C  tamsulosin (FLOMAX) 0.4 MG CAPS capsule Take 1 capsule (0.4 mg total) by mouth daily. 01/20/18   Jaynie Crumble, PA-C                                                                                                                                     Past Surgical History Past Surgical History:  Procedure Laterality Date   CESAREAN SECTION     Family History History reviewed. No pertinent family history.  Social History Social History   Tobacco Use   Smoking status: Never   Smokeless tobacco: Never  Substance Use Topics   Alcohol use: Never   Drug use: Never   Allergies Percocet [oxycodone-acetaminophen]  Review of Systems Review of Systems  Constitutional:  Negative for chills and fever.  Respiratory:  Negative for chest tightness.   Cardiovascular:  Negative for chest pain and palpitations.  Gastrointestinal:  Negative for abdominal pain and nausea.  Genitourinary:  Negative for dysuria.  Neurological:  Positive for light-headedness and headaches. Negative for weakness.  Psychiatric/Behavioral:  Positive for confusion.   All other systems reviewed and are negative.   Physical  Exam Vital Signs  I have reviewed the triage vital signs BP 138/79   Pulse 77   Temp 98.7 F (37.1 C)   Resp 16   Ht 5\' 2"  (1.575 m)   Wt 77.1 kg   SpO2 96%   BMI 31.09 kg/m  Physical Exam Vitals and nursing note reviewed.  Constitutional:      General: She is not in acute distress.    Appearance: Normal appearance.  HENT:     Head: Normocephalic.     Comments: 1 cm laceration to occiput    Right Ear: External ear normal.     Left Ear: External ear normal.     Nose: Nose normal.     Mouth/Throat:     Mouth: Mucous membranes are moist.  Eyes:     General: No scleral icterus.       Right eye: No discharge.        Left eye: No discharge.     Extraocular Movements: Extraocular movements intact.     Pupils: Pupils are equal, round, and reactive to light.  Cardiovascular:     Rate and Rhythm: Normal rate and regular rhythm.     Pulses: Normal pulses.     Heart sounds: Normal heart sounds.  Pulmonary:     Effort: Pulmonary effort is normal. No respiratory distress.     Breath sounds: Normal  breath sounds. No stridor.  Abdominal:     General: Abdomen is flat. There is no distension.     Palpations: Abdomen is soft.     Tenderness: There is no abdominal tenderness.  Musculoskeletal:     Cervical back: No rigidity.     Right lower leg: No edema.     Left lower leg: No edema.  Skin:    General: Skin is warm and dry.     Capillary Refill: Capillary refill takes less than 2 seconds.  Neurological:     Mental Status: She is alert and oriented to person, place, and time. Mental status is at baseline.     GCS: GCS eye subscore is 4. GCS verbal subscore is 5. GCS motor subscore is 6.     Cranial Nerves: Cranial nerves 2-12 are intact. No dysarthria or facial asymmetry.     Sensory: Sensation is intact.     Motor: Motor function is intact. No weakness or tremor.     Coordination: Coordination is intact. Finger-Nose-Finger Test normal.     Gait: Gait is intact.  Psychiatric:        Mood and Affect: Mood normal.        Behavior: Behavior normal. Behavior is cooperative.     ED Results and Treatments Labs (all labs ordered are listed, but only abnormal results are displayed) Labs Reviewed - No data to display                                                                                                                        Radiology CT  Cervical Spine Wo Contrast Result Date: 08/04/2023 CLINICAL DATA:  Neck trauma.  Laceration to the back of the head. EXAM: CT CERVICAL SPINE WITHOUT CONTRAST TECHNIQUE: Multidetector CT imaging of the cervical spine was performed without intravenous contrast. Multiplanar CT image reconstructions were also generated. RADIATION DOSE REDUCTION: This exam was performed according to the departmental dose-optimization program which includes automated exposure control, adjustment of the mA and/or kV according to patient size and/or use of iterative reconstruction technique. COMPARISON:  Radiography 03/06/2017 FINDINGS: Alignment: No malalignment. Skull  base and vertebrae: No cervical region fracture. Soft tissues and spinal canal: No traumatic soft tissue finding. Disc levels: Ordinary degenerative spondylosis and facet arthritis. Bony foraminal narrowing bilateral at C3-4, right more than left at C4-5, right at C5-6 and bilateral at C7-T1. Upper chest: No abnormality. Other: None IMPRESSION: No acute or traumatic finding. Ordinary degenerative spondylosis and facet arthritis. Bony foraminal narrowing at multiple levels as outlined above. Electronically Signed   By: Paulina Fusi M.D.   On: 08/04/2023 18:46   CT Head Wo Contrast Result Date: 08/04/2023 CLINICAL DATA:  Provided history: Head trauma, minor. Additional history provided: Fall (hitting back of head on concrete). Laceration to back of head. The patient reports feeling "tired." EXAM: CT HEAD WITHOUT CONTRAST TECHNIQUE: Contiguous axial images were obtained from the base of the skull through the vertex without intravenous contrast. RADIATION DOSE REDUCTION: This exam was performed according to the departmental dose-optimization program which includes automated exposure control, adjustment of the mA and/or kV according to patient size and/or use of iterative reconstruction technique. COMPARISON:  None. FINDINGS: Brain: No age-advanced or lobar predominant parenchymal atrophy. Small-volume acute subarachnoid hemorrhage along the left frontal and parietal lobes (best appreciated on series 5, image 38). No demarcated cortical infarct. No evidence of an intracranial mass. No midline shift. Vascular: No hyperdense vessel.  Atherosclerotic calcifications. Skull: No calvarial fracture or aggressive osseous lesion. Sinuses/Orbits: No mass or acute finding within the imaged orbits. No significant paranasal sinus disease. Other: Posterior scalp laceration and hematoma. Impression #1 called by telephone at the time of interpretation on 08/04/2023 at 5:50 pm to provider Tanda Rockers , who verbally acknowledged these  results. IMPRESSION: 1. Small-volume acute subarachnoid hemorrhage along the left frontal and parietal lobes. 2. Posterior scalp laceration and hematoma. Electronically Signed   By: Jackey Loge D.O.   On: 08/04/2023 17:52    Pertinent labs & imaging results that were available during my care of the patient were reviewed by me and considered in my medical decision making (see MDM for details).  Medications Ordered in ED Medications  Tdap (BOOSTRIX) injection 0.5 mL (0.5 mLs Intramuscular Given 08/04/23 1948)  lidocaine-EPINEPHrine-tetracaine (LET) topical gel (3 mLs Topical Given 08/04/23 2005)  Procedures .Laceration Repair  Date/Time: 08/04/2023 9:23 PM  Performed by: Sloan Leiter, DO Authorized by: Sloan Leiter, DO   Consent:    Consent obtained:  Verbal   Consent given by:  Patient   Risks, benefits, and alternatives were discussed: yes     Risks discussed:  Infection, pain and need for additional repair   Alternatives discussed:  No treatment and delayed treatment Universal protocol:    Patient identity confirmed:  Arm band and verbally with patient Anesthesia:    Anesthesia method:  Topical application   Topical anesthetic:  LET Laceration details:    Location:  Scalp   Scalp location:  Occipital   Length (cm):  1.5   Depth (mm):  2 Pre-procedure details:    Preparation:  Imaging obtained to evaluate for foreign bodies and patient was prepped and draped in usual sterile fashion Exploration:    Hemostasis achieved with:  Direct pressure   Imaging obtained comment:  Ct   Imaging outcome: foreign body not noted     Wound exploration: wound explored through full range of motion and entire depth of wound visualized   Treatment:    Area cleansed with:  Saline   Amount of cleaning:  Standard   Irrigation method:  Pressure wash   Debridement:   None   Undermining:  None Skin repair:    Repair method:  Staples   Number of staples:  2 Approximation:    Approximation:  Close Repair type:    Repair type:  Simple Post-procedure details:    Procedure completion:  Tolerated well, no immediate complications .Critical Care  Performed by: Sloan Leiter, DO Authorized by: Sloan Leiter, DO   Critical care provider statement:    Critical care time (minutes):  30   Critical care time was exclusive of:  Separately billable procedures and treating other patients   Critical care was necessary to treat or prevent imminent or life-threatening deterioration of the following conditions:  CNS failure or compromise   Critical care was time spent personally by me on the following activities:  Development of treatment plan with patient or surrogate, discussions with consultants, evaluation of patient's response to treatment, examination of patient, ordering and review of radiographic studies, ordering and performing treatments and interventions, pulse oximetry, re-evaluation of patient's condition, review of old charts and obtaining history from patient or surrogate   (including critical care time)  Medical Decision Making / ED Course    Medical Decision Making:    Aaradhana Sardo is a 68 y.o. female with past medical history as below, significant for depression, HLD, nephrolithiasis, C-section who presents to the ED with complaint of fall, head injury. . The complaint involves an extensive differential diagnosis and also carries with it a high risk of complications and morbidity.  Serious etiology was considered. Ddx includes but is not limited to: Differential diagnoses for head trauma includes subdural hematoma, epidural hematoma, acute concussion, traumatic subarachnoid hemorrhage, cerebral contusions, etc.   Complete initial physical exam performed, notably the patient was in no acute distress, neuroexam is nonfocal.    Reviewed and  confirmed nursing documentation for past medical history, family history, social history.  Vital signs reviewed.      Clinical Course as of 08/04/23 2125  Fri Aug 04, 2023  1800 CT head with small volume subarachnoid hemorrhage [SG]  1918 Spoke with neurosurgery, no need for repeat imaging.  Stable for discharge per neurosurgery standpoint [SG]    Clinical Course User  Index [SG] Sloan Leiter, DO    Brief summary: 68 year old female history as above here following slip and fall.  She is going to her occiput.  Bleeding controlled.  No thinners.  Neuroexam is not focal.  CT head with a small subarachnoid hemorrhage; left frontal and parietal. Will d/w NSGY  Per neurosurgery, no further intervention at this time.  Can discharge from a neurosurgery standpoint.  Repeat exam is nonfocal.  She is feeling better.  She has currently no symptoms, no headache, no dizziness.  Ambulatory with steady gait.  Acting at baseline per family at bedside.  Wound repaired at bedside, given wound care instructions, concussion instructions.  Recommend follow-up PCP   The patient improved significantly and was discharged in stable condition. Detailed discussions were had with the patient/guardian regarding current findings, and need for close f/u with PCP or on call doctor. The patient/guardian has been instructed to return immediately if the symptoms worsen in any way for re-evaluation. Patient/guardian verbalized understanding and is in agreement with current care plan. All questions answered prior to discharge.                Additional history obtained: -Additional history obtained from family -External records from outside source obtained and reviewed including: Chart review including previous notes, labs, imaging, consultation notes including  Primary care documentation, home med list, prior ED visit   Lab Tests: na  EKG   EKG Interpretation Date/Time:    Ventricular Rate:    PR  Interval:    QRS Duration:    QT Interval:    QTC Calculation:   R Axis:      Text Interpretation:           Imaging Studies ordered: I ordered imaging studies including CTH CT c-s I independently visualized the following imaging with scope of interpretation limited to determining acute life threatening conditions related to emergency care; findings noted above I independently visualized and interpreted imaging. I agree with the radiologist interpretation   Medicines ordered and prescription drug management: Meds ordered this encounter  Medications   Tdap (BOOSTRIX) injection 0.5 mL   lidocaine-EPINEPHrine-tetracaine (LET) topical gel    -I have reviewed the patients home medicines and have made adjustments as needed   Consultations Obtained: I requested consultation with the NSGY,  and discussed lab and imaging findings as well as pertinent plan - they recommend: dc   Cardiac Monitoring: Continuous pulse oximetry interpreted by myself, 97% on RA.    Social Determinants of Health:  Diagnosis or treatment significantly limited by social determinants of health: na   Reevaluation: After the interventions noted above, I reevaluated the patient and found that they have resolved  Co morbidities that complicate the patient evaluation  Past Medical History:  Diagnosis Date   Depression    Hypercholesterolemia    Kidney stone       Dispostion: Disposition decision including need for hospitalization was considered, and patient discharged from emergency department.    Final Clinical Impression(s) / ED Diagnoses Final diagnoses:  SAH (subarachnoid hemorrhage) (HCC)  Injury of head, initial encounter  Laceration of scalp, initial encounter        Sloan Leiter, DO 08/04/23 2125

## 2023-08-04 NOTE — Discharge Instructions (Signed)
Keep the wound clean and as dry as possible. Do not immerse or soak the wound in water. This means no swimming, baths, or hot tubs until the staples are removed. Recommend gentle soap/baby shampoo to clean the affected area. Return if you develop fever, pus draining from the wound or severe pain. Follow up with your pcp in 7-10 days for staple removal.   Based on the events which brought you to the ER today, it is possible that you may have a concussion. A concussion occurs when there is a blow to the head or body, with enough force to shake the brain and disrupt how the brain functions. You may experience symptoms such as headaches, sensitivity to light/noise, dizziness, cognitive slowing, difficulty concentrating / remembering, trouble sleeping and drowsiness. These symptoms may last anywhere from hours/days to potentially weeks/months. While these symptoms are very frustrating and perhaps debilitating, it is important that you remember that they will improve over time. Everyone has a different rate of recovery; it is difficult to predict when your symptoms will resolve. In order to allow for your brain to heal after the injury, we recommend that you see your primary physician or a physician knowledgeable in concussion management. We also advise you to let your body and brain rest: avoid physical activities (sports, gym, and exercise) and reduce cognitive demands (reading, texting, TV watching, computer use, video games, etc). School attendance, after-school activities and work may need to be modified to avoid increasing symptoms. We recommend against driving until until all symptoms have resolved. Come back to the ER right away if you are having repeated episodes of vomiting, severe/worsening headache/dizziness or any other symptom that alarms you. We recommended that someone stay with you for the next 24 hours to monitor for these worrisome symptoms.     You had a small amount of bleeding in your brain  called a sub arachnoid hemorrhage. This should resolve without intervention.   Please follow up with your pcp for recheck

## 2023-08-04 NOTE — ED Triage Notes (Signed)
Pt c/o laceration to back of head and feeling "tired". Pt reports she was outside, slipped on ice, falling backwards, hitting back of head on concrete that was covered in ice about an hour PTA.

## 2023-08-11 ENCOUNTER — Emergency Department (HOSPITAL_BASED_OUTPATIENT_CLINIC_OR_DEPARTMENT_OTHER): Payer: Medicare HMO

## 2023-08-11 ENCOUNTER — Encounter (HOSPITAL_BASED_OUTPATIENT_CLINIC_OR_DEPARTMENT_OTHER): Payer: Self-pay | Admitting: Emergency Medicine

## 2023-08-11 ENCOUNTER — Emergency Department (HOSPITAL_BASED_OUTPATIENT_CLINIC_OR_DEPARTMENT_OTHER)
Admission: EM | Admit: 2023-08-11 | Discharge: 2023-08-11 | Disposition: A | Discharge status: Home / Self Care | Payer: Medicare HMO | Attending: Emergency Medicine | Admitting: Emergency Medicine

## 2023-08-11 ENCOUNTER — Other Ambulatory Visit: Payer: Self-pay

## 2023-08-11 DIAGNOSIS — R5383 Other fatigue: Secondary | ICD-10-CM | POA: Insufficient documentation

## 2023-08-11 DIAGNOSIS — Z6836 Body mass index (BMI) 36.0-36.9, adult: Secondary | ICD-10-CM | POA: Diagnosis not present

## 2023-08-11 DIAGNOSIS — R519 Headache, unspecified: Secondary | ICD-10-CM | POA: Diagnosis not present

## 2023-08-11 DIAGNOSIS — S0191XA Laceration without foreign body of unspecified part of head, initial encounter: Secondary | ICD-10-CM | POA: Diagnosis not present

## 2023-08-11 DIAGNOSIS — S066X0A Traumatic subarachnoid hemorrhage without loss of consciousness, initial encounter: Secondary | ICD-10-CM | POA: Diagnosis not present

## 2023-08-11 DIAGNOSIS — I609 Nontraumatic subarachnoid hemorrhage, unspecified: Secondary | ICD-10-CM | POA: Diagnosis not present

## 2023-08-11 DIAGNOSIS — I6782 Cerebral ischemia: Secondary | ICD-10-CM | POA: Diagnosis not present

## 2023-08-11 DIAGNOSIS — F0781 Postconcussional syndrome: Secondary | ICD-10-CM | POA: Diagnosis not present

## 2023-08-11 DIAGNOSIS — R112 Nausea with vomiting, unspecified: Secondary | ICD-10-CM | POA: Diagnosis not present

## 2023-08-11 MED ORDER — ONDANSETRON 4 MG PO TBDP
4.0000 mg | ORAL_TABLET | Freq: Once | ORAL | Status: AC
  Administered 2023-08-11: 4 mg via ORAL
  Filled 2023-08-11: qty 1

## 2023-08-11 MED ORDER — ONDANSETRON 4 MG PO TBDP: 4.0000 mg | ORAL_TABLET | Freq: Three times a day (TID) | ORAL | 0 refills | Status: AC | PRN

## 2023-08-11 NOTE — ED Triage Notes (Signed)
Fall last week  "Brain bleed" Was vomiting at PCP while trying to get staples removed, send for re-eval CT

## 2023-08-11 NOTE — ED Provider Notes (Signed)
[place] EMERGENCY DEPARTMENT AT [facility] Provider Note   CSN: [id number] Arrival date & time: 08/11/23  1226     History  Chief Complaint  Patient presents with   Jennifer Richardson is a 68 y.o. female.  With a history of depression, hypercholesterolemia presenting to the ED for evaluation of nausea, vomiting.  She reports having a fall 1 week ago.  Presented to the emergency department at that time and was found to have a subarachnoid hemorrhage.  Also had a scalp laceration repaired with staples.  Neurosurgery had no further recommendations and patient was discharged home.  She returned to her primary care provider's office today for staple removal.  While having the staples removed she had an episode of emesis and near syncope.  She was sent to the emergency department for further evaluation.  She reports she has had nausea since the fall occurred but has not had any vomiting until today.  No seizure-like activity.  No headaches, vision changes, numbness, focal weakness, tingling.  She reports significant fatigue.  She is not anticoagulated.   Fall       Home Medications Prior to Admission medications   Medication Sig Start Date End Date Taking? Authorizing Provider  ondansetron (ZOFRAN-ODT) 4 MG disintegrating tablet Take 1 tablet (4 mg total) by mouth every 8 (eight) hours as needed for nausea or vomiting. 08/11/23  Yes Posey Petrik, Edsel Petrin, PA-C  oxyCODONE-acetaminophen (PERCOCET) 5-325 MG tablet Take 1 tablet by mouth every 4 (four) hours as needed for severe pain. 01/20/18   Kirichenko, Lemont Fillers, PA-C  tamsulosin (FLOMAX) 0.4 MG CAPS capsule Take 1 capsule (0.4 mg total) by mouth daily. 01/20/18   Jaynie Crumble, PA-C      Allergies    Percocet [oxycodone-acetaminophen]    Review of Systems   Review of Systems  Gastrointestinal:  Positive for nausea and vomiting.  All other systems reviewed and are negative.   Physical Exam Updated Vital Signs BP  137/64   Pulse 65   Temp 97.6 F (36.4 C) (Oral)   Resp 18   SpO2 95%  Physical Exam Vitals and nursing note reviewed.  Constitutional:      General: She is not in acute distress.    Appearance: Normal appearance. She is normal weight. She is not ill-appearing.  HENT:     Head: Normocephalic and atraumatic.  Pulmonary:     Effort: Pulmonary effort is normal. No respiratory distress.  Abdominal:     General: Abdomen is flat.  Musculoskeletal:        General: Normal range of motion.     Cervical back: Neck supple.  Skin:    General: Skin is warm and dry.  Neurological:     Mental Status: She is alert and oriented to person, place, and time.     Comments:   MENTAL STATUS: AAOx3   LANG/SPEECH: Fluent, intact naming, repetition & comprehension   CRANIAL NERVES:   II: Pupils equal and reactive   III, IV, VI: EOM intact, no gaze preference or deviation, no nystagmus   V: normal sensation of the face   VII: no facial asymmetry   VIII: normal hearing to speech   MOTOR: 5/5 in both upper and lower extremities   SENSORY: Normal to touch in all extremiteis   COORD: Normal finger to nose, heel to shin and shoulder shrug, no tremor, no dysmetria. No pronator drift   Psychiatric:        Mood and Affect:  Mood normal.        Behavior: Behavior normal.     ED Results / Procedures / Treatments   Labs (all labs ordered are listed, but only abnormal results are displayed) Labs Reviewed - No data to display  EKG None  Radiology CT Head Wo Contrast Result Date: 08/11/2023 CLINICAL DATA:  Headache, increasing frequency or severity. Fall last week. Vomiting. EXAM: CT HEAD WITHOUT CONTRAST TECHNIQUE: Contiguous axial images were obtained from the base of the skull through the vertex without intravenous contrast. RADIATION DOSE REDUCTION: This exam was performed according to the departmental dose-optimization program which includes automated exposure control, adjustment of the mA and/or kV  according to patient size and/or use of iterative reconstruction technique. COMPARISON:  Head CT 08/04/2023 FINDINGS: Brain: There is no evidence of an acute infarct, intracranial hemorrhage, mass, midline shift, or extra-axial fluid collection. Minimal left frontoparietal subarachnoid hemorrhage on the prior CT has resolved. Cerebral white matter hypodensities are unchanged and nonspecific but compatible with mild chronic small vessel ischemic disease. Cerebral volume is within normal limits for age with normal size of the ventricles. Vascular: No hyperdense vessel. Skull: No acute fracture or suspicious osseous lesion. Sinuses/Orbits: Visualized paranasal sinuses and mastoid air cells are clear. Unremarkable orbits. Other: None. IMPRESSION: 1. No evidence of acute intracranial abnormality. Interval resolution of subarachnoid hemorrhage. 2. Mild chronic small vessel ischemic disease. Electronically Signed   By: Sebastian Ache M.D.   On: 08/11/2023 14:21    Procedures Procedures    Medications Ordered in ED Medications  ondansetron (ZOFRAN-ODT) disintegrating tablet 4 mg (4 mg Oral Given 08/11/23 1414)    ED Course/ Medical Decision Making/ A&P                                 Medical Decision Making Amount and/or Complexity of Data Reviewed Radiology: ordered.  Risk Prescription drug management.  This patient presents to the ED for concern of nausea, vomiting after a fall, this involves an extensive number of treatment options, and is a complaint that carries with it a high risk of complications and morbidity.  The differential diagnosis includes worsening subarachnoid hemorrhage, postconcussive syndrome,  My initial workup includes imaging, symptom control  Additional history obtained from: Nursing notes from this visit. Previous records within EMR system ED visit for similar on 08/04/2023 Family husband at bedside  I ordered imaging studies including CT head I independently visualized  and interpreted imaging which showed no acute intracranial abnormalities, interval resolution of previously seen [facility] I agree with the radiologist interpretation  Afebrile, hemodynamically stable.  68 year old female presenting to the ED for evaluation of nausea and vomiting.  Vomiting occurred while having her staples removed at her PCP office prior to arrival.  She has been nauseous and fatigued since her fall 7 days ago.  Was sent here for repeat imaging.  CT head negative.  She reports significant improvement after Zofran in the emergency department and is able to tolerate oral intake.  Overall low suspicion for worsening SAH.  Suspect concussion.  She was encouraged to follow-up with her PCP.  She was given strict return precautions.  Prescription for Zofran was sent.  Stable at discharge.  At this time there does not appear to be any evidence of an acute emergency medical condition and the patient appears stable for discharge with appropriate outpatient follow up. Diagnosis was discussed with patient who verbalizes understanding of care plan and  is agreeable to discharge. I have discussed return precautions with patient and husband who verbalizes understanding. Patient encouraged to follow-up with their PCP within 1 week. All questions answered.  Patient's case discussed with Dr. Lynelle Doctor who agrees with plan to discharge with follow-up.   Note: Portions of this report may have been transcribed using voice recognition software. Every effort was made to ensure accuracy; however, inadvertent computerized transcription errors may still be present.         Final Clinical Impression(s) / ED Diagnoses Final diagnoses:  Nausea and vomiting, unspecified vomiting type    Rx / DC Orders ED Discharge Orders          Ordered    ondansetron (ZOFRAN-ODT) 4 MG disintegrating tablet  Every 8 hours PRN        08/11/23 1509              Mora Bellman 08/11/23 1515    Linwood Dibbles,  MD 08/12/23 [phone]

## 2023-08-11 NOTE — Discharge Instructions (Signed)
You have been seen today for your complaint of nausea, vomiting. Your imaging was reassuring. Your discharge medications include Zofran.  This is a nausea medicine.  Take this as needed. Follow up with: Your primary care provider Please seek immediate medical care if you develop any of the following symptoms: You have a severe or worsening headache. You have weakness or numbness in any part of your body, slurred speech, vision changes, or confusion. You vomit repeatedly. You lose consciousness, are sleepier than normal, or are difficult to wake up. You have a seizure. At this time there does not appear to be the presence of an emergent medical condition, however there is always the potential for conditions to change. Please read and follow the below instructions.  Do not take your medicine if  develop an itchy rash, swelling in your mouth or lips, or difficulty breathing; call 911 and seek immediate emergency medical attention if this occurs.  You may review your lab tests and imaging results in their entirety on your MyChart account.  Please discuss all results of fully with your primary care provider and other specialist at your follow-up visit.  Note: Portions of this text may have been transcribed using voice recognition software. Every effort was made to ensure accuracy; however, inadvertent computerized transcription errors may still be present.

## 2023-12-18 ENCOUNTER — Ambulatory Visit
Admission: RE | Admit: 2023-12-18 | Discharge: 2023-12-18 | Disposition: A | Discharge status: Home / Self Care | Payer: Medicare HMO | Source: Ambulatory Visit | Attending: Family Medicine | Admitting: Family Medicine

## 2023-12-18 DIAGNOSIS — E2839 Other primary ovarian failure: Secondary | ICD-10-CM

## 2023-12-18 DIAGNOSIS — N958 Other specified menopausal and perimenopausal disorders: Secondary | ICD-10-CM | POA: Diagnosis not present

## 2024-03-19 DIAGNOSIS — R21 Rash and other nonspecific skin eruption: Secondary | ICD-10-CM | POA: Diagnosis not present

## 2024-03-19 DIAGNOSIS — Z6834 Body mass index (BMI) 34.0-34.9, adult: Secondary | ICD-10-CM | POA: Diagnosis not present

## 2024-03-19 DIAGNOSIS — F430 Acute stress reaction: Secondary | ICD-10-CM | POA: Diagnosis not present

## 2024-03-19 DIAGNOSIS — F339 Major depressive disorder, recurrent, unspecified: Secondary | ICD-10-CM | POA: Diagnosis not present

## 2024-06-12 ENCOUNTER — Other Ambulatory Visit: Payer: Self-pay

## 2024-06-12 ENCOUNTER — Emergency Department (HOSPITAL_BASED_OUTPATIENT_CLINIC_OR_DEPARTMENT_OTHER)
Admission: EM | Admit: 2024-06-12 | Discharge: 2024-06-12 | Disposition: A | Discharge status: Home / Self Care | Attending: Emergency Medicine | Admitting: Emergency Medicine

## 2024-06-12 ENCOUNTER — Emergency Department (HOSPITAL_BASED_OUTPATIENT_CLINIC_OR_DEPARTMENT_OTHER)

## 2024-06-12 ENCOUNTER — Encounter (HOSPITAL_BASED_OUTPATIENT_CLINIC_OR_DEPARTMENT_OTHER): Payer: Self-pay

## 2024-06-12 DIAGNOSIS — N201 Calculus of ureter: Secondary | ICD-10-CM

## 2024-06-12 DIAGNOSIS — R109 Unspecified abdominal pain: Secondary | ICD-10-CM | POA: Diagnosis not present

## 2024-06-12 DIAGNOSIS — N200 Calculus of kidney: Secondary | ICD-10-CM | POA: Diagnosis not present

## 2024-06-12 DIAGNOSIS — R7989 Other specified abnormal findings of blood chemistry: Secondary | ICD-10-CM | POA: Diagnosis not present

## 2024-06-12 DIAGNOSIS — N132 Hydronephrosis with renal and ureteral calculous obstruction: Secondary | ICD-10-CM | POA: Insufficient documentation

## 2024-06-12 DIAGNOSIS — M545 Low back pain, unspecified: Secondary | ICD-10-CM | POA: Diagnosis not present

## 2024-06-12 LAB — URINALYSIS, ROUTINE W REFLEX MICROSCOPIC
Bacteria, UA: NONE SEEN
Bilirubin Urine: NEGATIVE
Glucose, UA: NEGATIVE mg/dL
Ketones, ur: 15 mg/dL — AB
Nitrite: NEGATIVE
RBC / HPF: >50 RBC/hpf (ref 0–5)
Specific Gravity, Urine: 1.023 (ref 1.005–1.030)
pH: 5.5 (ref 5.0–8.0)

## 2024-06-12 LAB — CBC
HCT: 43.4 % (ref 36.0–46.0)
Hemoglobin: 14.9 g/dL (ref 12.0–15.0)
MCH: 31.8 pg (ref 26.0–34.0)
MCHC: 34.3 g/dL (ref 30.0–36.0)
MCV: 92.5 fL (ref 80.0–100.0)
Platelets: 328 K/uL (ref 150–400)
RBC: 4.69 MIL/uL (ref 3.87–5.11)
RDW: 13.5 % (ref 11.5–15.5)
WBC: 9.3 K/uL (ref 4.0–10.5)
nRBC: 0 % (ref 0.0–0.2)

## 2024-06-12 LAB — BASIC METABOLIC PANEL WITH GFR
Anion gap: 13 (ref 5–15)
BUN: 14 mg/dL (ref 8–23)
CO2: 27 mmol/L (ref 22–32)
Calcium: 10.2 mg/dL (ref 8.9–10.3)
Chloride: 97 mmol/L — ABNORMAL LOW (ref 98–111)
Creatinine, Ser: 1.3 mg/dL — ABNORMAL HIGH (ref 0.44–1.00)
GFR, Estimated: 44 mL/min — ABNORMAL LOW (ref >60)
Glucose, Bld: 134 mg/dL — ABNORMAL HIGH (ref 70–99)
Potassium: 3.8 mmol/L (ref 3.5–5.1)
Sodium: 137 mmol/L (ref 135–145)

## 2024-06-12 MED ORDER — KETOROLAC TROMETHAMINE 15 MG/ML IJ SOLN
15.0000 mg | Freq: Once | INTRAMUSCULAR | Status: AC
  Administered 2024-06-12: 15 mg via INTRAVENOUS
  Filled 2024-06-12: qty 1

## 2024-06-12 MED ORDER — HYDROMORPHONE HCL 1 MG/ML IJ SOLN: 1.0000 mg | Freq: Once | INTRAMUSCULAR | Status: DC

## 2024-06-12 MED ORDER — SODIUM CHLORIDE 0.9 % IV BOLUS
1000.0000 mL | Freq: Once | INTRAVENOUS | Status: AC
  Administered 2024-06-12: 1000 mL via INTRAVENOUS

## 2024-06-12 MED ORDER — TAMSULOSIN HCL 0.4 MG PO CAPS: 0.4000 mg | ORAL_CAPSULE | Freq: Every day | ORAL | 0 refills | Status: AC

## 2024-06-12 MED ORDER — ONDANSETRON 4 MG PO TBDP: 4.0000 mg | ORAL_TABLET | Freq: Three times a day (TID) | ORAL | 0 refills | Status: AC | PRN

## 2024-06-12 MED ORDER — HYDROCODONE-ACETAMINOPHEN 5-325 MG PO TABS: 1.0000 | ORAL_TABLET | Freq: Four times a day (QID) | ORAL | 0 refills | Status: AC | PRN

## 2024-06-12 MED ORDER — ONDANSETRON HCL 4 MG/2ML IJ SOLN
4.0000 mg | Freq: Once | INTRAMUSCULAR | Status: AC
  Administered 2024-06-12: 4 mg via INTRAVENOUS
  Filled 2024-06-12: qty 2

## 2024-06-12 MED ORDER — MORPHINE SULFATE (PF) 4 MG/ML IV SOLN
4.0000 mg | Freq: Once | INTRAVENOUS | Status: AC
  Administered 2024-06-12: 4 mg via INTRAVENOUS
  Filled 2024-06-12: qty 1

## 2024-06-12 NOTE — Discharge Instructions (Signed)
 It was a pleasure taking care of you today.  As discussed, you have a 3 mm stone which is likely causing your pain.  I am sending you home with pain medication.  Take for severe pain.  Medication can cause drowsiness so do not drive or operate machinery while on the medication. I am also sending you home with a medication to help pass the stone.  Take daily.  I have included the number of urology.  Please call to schedule an appointment for further evaluation.  Return to the ER for new or worsening symptoms.

## 2024-06-12 NOTE — ED Triage Notes (Signed)
 Pt c/o extreme pain in L back started 2-3 days ago, worsening since. Hx kidney stones, advises it feels exactly the same.  1 tylenol  PTA

## 2024-06-12 NOTE — ED Provider Notes (Signed)
 [place] EMERGENCY DEPARTMENT AT [facility] Provider Note   CSN: [id number] Arrival date & time: 06/12/24  1421     Patient presents with: Flank Pain (L)   Jennifer Richardson is a 68 y.o. female with a past medical history significant for hyperlipidemia, depression, and history of kidney stones who presents to the ED due to left low back pain x 2 to 3 days.  Patient states pain feels similar to her previous kidney stones.  Has had 2 stones in the past which she has been able to pass on her own without intervention.  Denies any hematuria or dysuria.  No fever or chills.  Denies saddle anesthesia, bowel/bladder incontinence, lower extremity numbness/tingling, lower extremity weakness.  Denies any abdominal pain.  Admits to some nausea and vomiting.  No diarrhea.  History obtained from patient and past medical records. No interpreter used during encounter.       Prior to Admission medications   Medication Sig Start Date End Date Taking? Authorizing Provider  HYDROcodone -acetaminophen  (NORCO/VICODIN) 5-325 MG tablet Take 1 tablet by mouth every 6 (six) hours as needed. 06/12/24  Yes Idaliz Tinkle C, PA-C  ondansetron  (ZOFRAN -ODT) 4 MG disintegrating tablet Take 1 tablet (4 mg total) by mouth every 8 (eight) hours as needed for nausea or vomiting. 06/12/24  Yes Willine Schwalbe, Aleck BROCKS, PA-C  tamsulosin  (FLOMAX ) 0.4 MG CAPS capsule Take 1 capsule (0.4 mg total) by mouth daily. 06/12/24  Yes Malissie Musgrave, Aleck BROCKS, PA-C  ondansetron  (ZOFRAN -ODT) 4 MG disintegrating tablet Take 1 tablet (4 mg total) by mouth every 8 (eight) hours as needed for nausea or vomiting. 08/11/23   Schutt, Marsa HERO, PA-C  oxyCODONE -acetaminophen  (PERCOCET) 5-325 MG tablet Take 1 tablet by mouth every 4 (four) hours as needed for severe pain. 01/20/18   Kirichenko, Tatyana, PA-C  tamsulosin  (FLOMAX ) 0.4 MG CAPS capsule Take 1 capsule (0.4 mg total) by mouth daily. 01/20/18   Kirichenko, Tatyana, PA-C    Allergies:  Percocet [oxycodone -acetaminophen ]    Review of Systems  Gastrointestinal:  Positive for nausea and vomiting. Negative for abdominal pain.  Musculoskeletal:  Positive for back pain.    Updated Vital Signs BP 136/60   Pulse 64   Temp 98.6 F (37 C) (Temporal)   Resp 16   SpO2 100%   Physical Exam Vitals and nursing note reviewed.  Constitutional:      General: She is not in acute distress.    Appearance: She is not ill-appearing.  HENT:     Head: Normocephalic.  Eyes:     Pupils: Pupils are equal, round, and reactive to light.  Cardiovascular:     Rate and Rhythm: Normal rate and regular rhythm.     Pulses: Normal pulses.     Heart sounds: Normal heart sounds. No murmur heard.    No friction rub. No gallop.  Pulmonary:     Effort: Pulmonary effort is normal.     Breath sounds: Normal breath sounds.  Abdominal:     General: Abdomen is flat. There is no distension.     Palpations: Abdomen is soft.     Tenderness: There is no abdominal tenderness. There is left CVA tenderness. There is no guarding or rebound.  Musculoskeletal:        General: Normal range of motion.     Cervical back: Neck supple.  Skin:    General: Skin is warm and dry.  Neurological:     General: No focal deficit present.     Mental Status:  She is alert.  Psychiatric:        Mood and Affect: Mood normal.        Behavior: Behavior normal.     (all labs ordered are listed, but only abnormal results are displayed) Labs Reviewed  URINALYSIS, ROUTINE W REFLEX MICROSCOPIC - Abnormal; Notable for the following components:      Result Value   Hgb urine dipstick LARGE (*)    Ketones, ur 15 (*)    Protein, ur TRACE (*)    Leukocytes,Ua TRACE (*)    All other components within normal limits  BASIC METABOLIC PANEL WITH GFR - Abnormal; Notable for the following components:   Chloride 97 (*)    Glucose, Bld 134 (*)    Creatinine, Ser 1.30 (*)    GFR, Estimated 44 (*)    All other components within  normal limits  CBC    EKG: None  Radiology: CT Renal Stone Study Result Date: 06/12/2024 CLINICAL DATA:  Abdominal/flank pain, stone suspected EXAM: CT ABDOMEN AND PELVIS WITHOUT CONTRAST TECHNIQUE: Multidetector CT imaging of the abdomen and pelvis was performed following the standard protocol without IV contrast. RADIATION DOSE REDUCTION: This exam was performed according to the departmental dose-optimization program which includes automated exposure control, adjustment of the mA and/or kV according to patient size and/or use of iterative reconstruction technique. COMPARISON:  None Available. FINDINGS: Of note, the lack of intravenous contrast limits evaluation of the solid organ parenchyma and vascularity. Lower chest: No focal airspace consolidation or pleural effusion. Hepatobiliary: No mass.No radiopaque stones or wall thickening of the gallbladder. No intrahepatic or extrahepatic biliary ductal dilation. Pancreas: No mass or main ductal dilation. No peripancreatic inflammation or fluid collection. Spleen: Normal size. No mass. Adrenals/Urinary Tract: No adrenal masses. No renal mass. Mild left-sided hydroureteronephrosis due to a small obstructive calculus in the left ureteral orifice at the level of the bladder trigone, measuring 3 mm. The urinary bladder is completely decompressed. Stomach/Bowel: The stomach is decompressed without focal abnormality. No small bowel wall thickening or inflammation. No small bowel obstruction.Normal appendix. Vascular/Lymphatic: No aortic aneurysm. No intraabdominal or pelvic lymphadenopathy. Reproductive: The uterus and ovaries are within normal limits for patient's age. No free pelvic fluid. Other: No pneumoperitoneum, ascites, or mesenteric inflammation. Musculoskeletal: No acute fracture or destructive lesion. Multilevel degenerative disc disease of the spine. IMPRESSION: Obstructive calculus in the left ureteral orifice of the level of the bladder trigone,  measuring 3 mm, causing mild hydroureteronephrosis. Electronically Signed   By: Rogelia Myers M.D.   On: 06/12/2024 17:08     Procedures   Medications Ordered in the ED  morphine  (PF) 4 MG/ML injection 4 mg (4 mg Intravenous Given 06/12/24 1448)  ondansetron  (ZOFRAN ) injection 4 mg (4 mg Intravenous Given 06/12/24 1448)  sodium chloride  0.9 % bolus 1,000 mL (1,000 mLs Intravenous New Bag/Given 06/12/24 1603)  ketorolac  (TORADOL ) 15 MG/ML injection 15 mg (15 mg Intravenous Given 06/12/24 1542)    Clinical Course as of 06/12/24 1725  Wed Jun 12, 2024  1520 Creatinine(!): 1.30 [CA]  1540 Reassessed patient at bedside.  Still with significant amount of pain.  IV Toradol  given. [CA]  1630 Reassessed patient.  Patient with significant improvement in pain after Toradol .  Patient made aware need for urine for UA. [CA]  1719 reassessed patient at bedside.  Pain has resolved. [CA]    Clinical Course User Index [CA] Lorelle Aleck BROCKS, PA-C  Medical Decision Making Amount and/or Complexity of Data Reviewed Labs: ordered. Decision-making details documented in ED Course. Radiology: ordered and independent interpretation performed. Decision-making details documented in ED Course.  Risk Prescription drug management.   This patient presents to the ED for concern of left low back pain, this involves an extensive number of treatment options, and is a complaint that carries with it a high risk of complications and morbidity.  The differential diagnosis includes kidney stone, shingles, MSK etiology, etc  68 year old female presents to the ED due to left low back pain x 2-3 days.  History of kidney stones and notes that it feels similar.  No fever or chills.  Has not required intervention for previous kidney stones.  Upon arrival, vitals all within normal limits.  Patient appears uncomfortable leaning over in wheelchair with emesis bag.  Does have left CVA tenderness.   No rash to suggest shingles.  Bilateral lower extremities neurovascularly intact.  Low suspicion for cauda equina or central cord compression.  Abdomen soft, nondistended, nontender.  Routine labs ordered.  UA to rule out infection.  CT renal study.  IV morphine  and Zofran  given.  CBC with no leukocytosis.  Normal hemoglobin.  BMP with elevated creatinine 1.3.  Normal BUN. IVFs given. UA with trace leukocytes.  No nitrites.  No bacteria.  Does have 21-50 white blood cells.  CT renal stone study personally reviewed and interpreted which demonstrates an obstructive calculus in the left ureteral orifice of the level of the bladder trigone measuring 3 mm.  Mild hydroureteronephrosis. Low suspicion for infected stone given UA results. Discussed with Dr. Pamella who agrees with assessment and plan. No evidence of urinary retention. Patient able to urinate without difficulty. No infectious symptoms to suggest acute cystitis. Advised patient to follow-up with urology for follow-up. Number given at discharge. Patient discharged with strainer. Strict ED precautions discussed with patient. Patient states understanding and agrees to plan. Patient discharged home in no acute distress and stable vitals  Co morbidities that complicate the patient evaluation  HL, depression   Social Determinants of Health:  Lives independently   Test / Admission - Considered:  Considered admission however, workup reassuring.  Low suspicion for infected stone.  No evidence of urinary retention.  Patient stable for discharge with outpatient urology follow-up.      Final diagnoses:  Ureteral stone    ED Discharge Orders          Ordered    HYDROcodone -acetaminophen  (NORCO/VICODIN) 5-325 MG tablet  Every 6 hours PRN        06/12/24 1723    ondansetron  (ZOFRAN -ODT) 4 MG disintegrating tablet  Every 8 hours PRN        06/12/24 1723    tamsulosin  (FLOMAX ) 0.4 MG CAPS capsule  Daily        06/12/24 1723                Lorelle Aleck BROCKS, PA-C 06/12/24 1725    Pamella Ozell LABOR, DO 06/19/24 [phone]

## 2024-06-20 DIAGNOSIS — R202 Paresthesia of skin: Secondary | ICD-10-CM | POA: Diagnosis not present

## 2024-06-20 DIAGNOSIS — R200 Anesthesia of skin: Secondary | ICD-10-CM | POA: Diagnosis not present

## 2024-06-26 DIAGNOSIS — Z1231 Encounter for screening mammogram for malignant neoplasm of breast: Secondary | ICD-10-CM | POA: Diagnosis not present
